# Patient Record
Sex: Female | Born: 1990 | Race: White | Hispanic: No | Marital: Married | Smoking: Never smoker
Health system: Southern US, Community
[De-identification: ages and names within clinical notes are randomized; demographics above are authoritative.]

## PROBLEM LIST (undated history)

## (undated) ENCOUNTER — Inpatient Hospital Stay (HOSPITAL_COMMUNITY): Payer: Self-pay

## (undated) DIAGNOSIS — F418 Other specified anxiety disorders: Secondary | ICD-10-CM

## (undated) HISTORY — DX: Other specified anxiety disorders: F41.8

## (undated) HISTORY — PX: TYMPANOSTOMY TUBE PLACEMENT: SHX32

---
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
## 2011-05-09 ENCOUNTER — Emergency Department: Payer: Self-pay | Admitting: Emergency Medicine

## 2011-06-07 ENCOUNTER — Ambulatory Visit: Payer: Self-pay

## 2011-09-25 ENCOUNTER — Ambulatory Visit: Payer: Self-pay | Admitting: Family Medicine

## 2013-05-06 ENCOUNTER — Other Ambulatory Visit: Payer: Self-pay | Admitting: Orthopedic Surgery

## 2013-05-06 DIAGNOSIS — M25562 Pain in left knee: Secondary | ICD-10-CM

## 2013-05-12 ENCOUNTER — Ambulatory Visit
Admission: RE | Admit: 2013-05-12 | Discharge: 2013-05-12 | Disposition: A | Discharge status: Home / Self Care | Payer: BC Managed Care – PPO | Source: Ambulatory Visit | Attending: Orthopedic Surgery | Admitting: Orthopedic Surgery

## 2013-05-12 DIAGNOSIS — M25562 Pain in left knee: Secondary | ICD-10-CM

## 2015-07-19 ENCOUNTER — Ambulatory Visit (INDEPENDENT_AMBULATORY_CARE_PROVIDER_SITE_OTHER): Payer: BLUE CROSS/BLUE SHIELD | Admitting: Family Medicine

## 2015-07-19 ENCOUNTER — Encounter: Payer: Self-pay | Admitting: Family Medicine

## 2015-07-19 VITALS — BP 102/74 | HR 80 | Temp 98.1°F | Resp 16 | Ht 65.0 in | Wt 140.0 lb

## 2015-07-19 DIAGNOSIS — H8103 Meniere's disease, bilateral: Secondary | ICD-10-CM

## 2015-07-19 DIAGNOSIS — J209 Acute bronchitis, unspecified: Secondary | ICD-10-CM

## 2015-07-19 DIAGNOSIS — Z3009 Encounter for other general counseling and advice on contraception: Secondary | ICD-10-CM | POA: Insufficient documentation

## 2015-07-19 DIAGNOSIS — F900 Attention-deficit hyperactivity disorder, predominantly inattentive type: Secondary | ICD-10-CM | POA: Insufficient documentation

## 2015-07-19 DIAGNOSIS — N83209 Unspecified ovarian cyst, unspecified side: Secondary | ICD-10-CM | POA: Insufficient documentation

## 2015-07-19 DIAGNOSIS — D75839 Thrombocytosis, unspecified: Secondary | ICD-10-CM | POA: Insufficient documentation

## 2015-07-19 DIAGNOSIS — D473 Essential (hemorrhagic) thrombocythemia: Secondary | ICD-10-CM | POA: Insufficient documentation

## 2015-07-19 MED ORDER — AZITHROMYCIN 250 MG PO TABS: ORAL_TABLET | ORAL | Status: DC

## 2015-07-19 MED ORDER — FLUCONAZOLE 150 MG PO TABS: 150.0000 mg | ORAL_TABLET | Freq: Once | ORAL | Status: DC

## 2015-07-19 NOTE — Progress Notes (Signed)
Subjective:    Patient ID: Ariel Thompson, female    DOB: 09-Jan-1991, 25 y.o.   MRN: [id number]  URI  This is a new problem. The current episode started in the past 7 days. The problem has been gradually worsening. There has been no fever. Associated symptoms include chest pain (secondary to cough), congestion, coughing (dry), a plugged ear sensation (pt has Meniere's dx), rhinorrhea, sneezing, a sore throat (from coughing) and swollen glands. Pertinent negatives include no abdominal pain, ear pain, headaches, nausea, sinus pain, vomiting or wheezing. Treatments tried: Mucinex DM, Allegra. The treatment provided mild relief.  Pt reports she is getting married on Saturday, and is requesting relief from sx before her wedding day.    Review of Systems  HENT: Positive for congestion, rhinorrhea, sneezing and sore throat (from coughing). Negative for ear pain.   Respiratory: Positive for cough (dry). Negative for wheezing.   Cardiovascular: Positive for chest pain (secondary to cough).  Gastrointestinal: Negative for nausea, vomiting and abdominal pain.  Neurological: Negative for headaches.   BP 102/74 mmHg  Pulse 80  Temp(Src) 98.1 F (36.7 C) (Oral)  Resp 16  Ht 5\' 5"  (1.651 m)  Wt 140 lb (63.504 kg)  BMI 23.30 kg/m2  SpO2 96%   Patient Active Problem List   Diagnosis Date Noted  . 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 Family planning 07/19/2015  . Cyst of ovary 07/19/2015  . Thrombocythemia ([place]) 07/19/2015  . Headache, migraine 12/18/2006  . Irregular bleeding 12/12/2006  . Allergic rhinitis 11/14/2005   History reviewed. No pertinent past medical history. No current outpatient prescriptions on file prior to visit.   No current facility-administered medications on file prior to visit.   Allergies  Allergen Reactions  . Hydrocodone-Acetaminophen     tachycardia, anxiety, hallucinations.   Past Surgical History  Procedure  Laterality Date  . Tonsillectomy  2010  . Tympanostomy tube placement     Social History   Social History  . Marital Status: Married    Spouse Name: N/A  . Number of Children: N/A  . Years of Education: N/A   Occupational History  . Not on file.   Social History Main Topics  . Smoking status: Never Smoker   . Smokeless tobacco: Never Used  . Alcohol Use: Yes     Comment: occasional  . Drug Use: No  . Sexual Activity: Yes    Birth Control/ Protection: IUD   Other Topics Concern  . Not on file   Social History Narrative   Family History  Problem Relation Age of Onset  . Mitral valve prolapse Mother   . Migraines Mother   . Other Mother     IBS  . Hypothyroidism Mother   . Depression Father   . Asthma Sister   . Migraines Sister   . Allergic rhinitis Sister   . Allergic rhinitis Brother   . Migraines Brother   . Heart Problems Brother     heart valve abnormality       Objective:   Physical Exam  Constitutional: She appears well-developed and well-nourished.  HENT:  Head: Normocephalic.  Right Ear: A middle ear effusion is present.  Left Ear: A middle ear effusion is present.  Mouth/Throat: Oropharynx is clear and moist.  Turbinates are swollen  Eyes: Conjunctivae are normal.  Cardiovascular: Normal rate, regular rhythm and normal heart sounds.   Pulmonary/Chest: Effort normal and breath sounds normal. No respiratory distress. She has no wheezes.  Psychiatric: She has a normal mood and affect. Her behavior is normal.   BP 102/74 mmHg  Pulse 80  Temp(Src) 98.1 F (36.7 C) (Oral)  Resp 16  Ht 5\' 5"  (1.651 m)  Wt 140 lb (63.504 kg)  BMI 23.30 kg/m2  SpO2 96%     Assessment & Plan:  1. Meniere disease, bilateral Unchanged.   2. Acute bronchitis, unspecified organism New problem, worsening. Will write for medication noted but would like patient to wait for one more day before starting. Start only if does not improve on it's own.  Patient instructed  to call back if condition worsens or does not improve with treatment. Is getting married this weekend and going to [place].   - azithromycin (ZITHROMAX) 250 MG tablet; Take 2 tablets on day 1, then 1 tablet each remaining day until gone.  Dispense: 6 each; Refill: 0 - fluconazole (DIFLUCAN) 150 MG tablet; Take 1 tablet (150 mg total) by mouth once.  Dispense: 1 tablet; Refill: 0  Patient was seen and examined by Jerrell Belfast, MD, and note scribed by Renaldo Fiddler, CMA. I have reviewed the document for accuracy and completeness and I agree with above. Jerrell Belfast, MD   Margarita Rana, MD

## 2016-02-14 LAB — HM PAP SMEAR

## 2016-04-16 ENCOUNTER — Encounter: Payer: Self-pay | Admitting: Physician Assistant

## 2016-04-16 ENCOUNTER — Other Ambulatory Visit: Payer: Self-pay | Admitting: Physician Assistant

## 2016-04-16 ENCOUNTER — Ambulatory Visit (INDEPENDENT_AMBULATORY_CARE_PROVIDER_SITE_OTHER): Payer: BC Managed Care – PPO | Admitting: Physician Assistant

## 2016-04-16 VITALS — BP 122/84 | HR 82 | Temp 98.4°F | Resp 16 | Wt 171.0 lb

## 2016-04-16 DIAGNOSIS — N912 Amenorrhea, unspecified: Secondary | ICD-10-CM | POA: Diagnosis not present

## 2016-04-16 DIAGNOSIS — R110 Nausea: Secondary | ICD-10-CM | POA: Diagnosis not present

## 2016-04-16 LAB — POCT URINE PREGNANCY: Preg Test, Ur: NEGATIVE

## 2016-04-16 NOTE — Progress Notes (Signed)
       Patient: Ariel Thompson Female    DOB: [birth year]   25 y.o.   MRN: [id number] Visit Date: 04/16/2016  Today's Provider: Trinna Post, PA-C   Chief Complaint  Patient presents with  . Nausea   Subjective:    HPI Patient comes in office today with concerns of nausea since 04/12/16. Patient reports that she has concerns that she might possibly be pregnant. She has the [place] IUD. She gets periods about once every two months.  Patient states that she has been fatigued and nauseas from certain foods she normally likes and eats. She has been nauseated without vomiting. No problems with bowels or bladder. She drinks coffee in the morning. Denies a lot of oily/fried foods. Says nausea is worse at night when she lies down. Does report eating food late at night and also spicy foods.    Allergies  Allergen Reactions  . Hydrocodone-Acetaminophen     tachycardia, anxiety, hallucinations.     Current Outpatient Prescriptions:  .  Levonorgestrel (SKYLA) 13.5 MG IUD, by Intrauterine route., Disp: , Rfl:   Review of Systems  Constitutional: Positive for appetite change and fatigue. Negative for activity change, chills, diaphoresis and unexpected weight change.  HENT: Negative.   Eyes: Negative.   Respiratory: Negative.   Cardiovascular: Negative.   Gastrointestinal: Positive for nausea.  Endocrine: Negative.   Genitourinary: Positive for vaginal discharge.  Musculoskeletal: Negative.   Skin: Negative.   Allergic/Immunologic: Negative.   Neurological: Negative.   Hematological: Negative.   Psychiatric/Behavioral: Negative.     Social History  Substance Use Topics  . Smoking status: Never Smoker  . Smokeless tobacco: Never Used  . Alcohol use Yes     Comment: occasional   Objective:   BP 122/84   Pulse 82   Temp 98.4 F (36.9 C) (Oral)   Resp 16   Wt 171 lb (77.6 kg)   LMP 01/22/2016   BMI 28.46 kg/m  Vitals:   04/16/16 1509  BP: 122/84  Pulse: 82  Resp: 16    Temp: 98.4 F (36.9 C)  TempSrc: Oral  Weight: 171 lb (77.6 kg)     Physical Exam  Constitutional: She appears well-developed and well-nourished.  Cardiovascular: Normal rate and regular rhythm.   Pulmonary/Chest: Effort normal and breath sounds normal.  Abdominal: Soft. Bowel sounds are normal. She exhibits no distension and no mass. There is no tenderness. There is no rebound and no guarding.  Skin: Skin is warm and dry.  Psychiatric: She has a normal mood and affect. Her behavior is normal.        Assessment & Plan:     1. Absence of menstruation  Will evaluate as below.  - B-HCG Quant  2. Nausea  Negative urine pregnancy. Will get HCG. Suspect nausea is 2/2 heartburn, patient can try omeprazole OTC to see if this helps symptoms.  - POCT urine pregnancy - B-HCG Quant  Return if symptoms worsen or fail to improve.  The entirety of the information documented in the History of Present Illness, Review of Systems and Physical Exam were personally obtained by me. Portions of this information were initially documented by Nunzio Cory and reviewed by me for thoroughness and accuracy.           Trinna Post, PA-C  [facility]

## 2016-04-16 NOTE — Progress Notes (Deleted)
Name: Ariel Thompson   MRN: [id number]    DOB: 06-23-1990   Date:04/16/2016       Progress Note  Subjective  Chief Complaint  No chief complaint on file.   HPI  *** No problem-specific Assessment & Plan notes found for this encounter.   No past medical history on file.  Social History  Substance Use Topics  . Smoking status: Never Smoker  . Smokeless tobacco: Never Used  . Alcohol use Yes     Comment: occasional     Current Outpatient Prescriptions:  .  azithromycin (ZITHROMAX) 250 MG tablet, Take 2 tablets on day 1, then 1 tablet each remaining day until gone., Disp: 6 each, Rfl: 0 .  fexofenadine (ALLEGRA) 180 MG tablet, Take 180 mg by mouth daily., Disp: , Rfl:  .  fluconazole (DIFLUCAN) 150 MG tablet, Take 1 tablet (150 mg total) by mouth once., Disp: 1 tablet, Rfl: 0 .  Levonorgestrel (SKYLA) 13.5 MG IUD, by Intrauterine route., Disp: , Rfl:   Allergies  Allergen Reactions  . Hydrocodone-Acetaminophen     tachycardia, anxiety, hallucinations.    ROS  ***  Objective  There were no vitals filed for this visit.   Physical Exam  ***  No results found for this or any previous visit (from the past 2160 hour(s)).   Assessment & Plan  1. Absence of menstruation *** - POCT urine pregnancy

## 2016-04-16 NOTE — Patient Instructions (Signed)
Gastroesophageal Reflux Disease, Adult Normally, food travels down the esophagus and stays in the stomach to be digested. If a person has gastroesophageal reflux disease (GERD), food and stomach acid move back up into the esophagus. When this happens, the esophagus becomes sore and swollen (inflamed). Over time, GERD can make small holes (ulcers) in the lining of the esophagus. Follow these instructions at home: Diet   Follow a diet as told by your doctor. You may need to avoid foods and drinks such as:  Coffee and tea (with or without caffeine).  Drinks that contain alcohol.  Energy drinks and sports drinks.  Carbonated drinks or sodas.  Chocolate and cocoa.  Peppermint and mint flavorings.  Garlic and onions.  Horseradish.  Spicy and acidic foods, such as peppers, chili powder, curry powder, vinegar, hot sauces, and BBQ sauce.  Citrus fruit juices and citrus fruits, such as oranges, lemons, and limes.  Tomato-based foods, such as red sauce, chili, salsa, and pizza with red sauce.  Fried and fatty foods, such as donuts, french fries, potato chips, and high-fat dressings.  High-fat meats, such as hot dogs, rib eye steak, sausage, ham, and bacon.  High-fat dairy items, such as whole milk, butter, and cream cheese.  Eat small meals often. Avoid eating large meals.  Avoid drinking large amounts of liquid with your meals.  Avoid eating meals during the 2-3 hours before bedtime.  Avoid lying down right after you eat.  Do not exercise right after you eat. General instructions   Pay attention to any changes in your symptoms.  Take over-the-counter and prescription medicines only as told by your doctor. Do not take aspirin, ibuprofen, or other NSAIDs unless your doctor says it is okay.  Do not use any tobacco products, including cigarettes, chewing tobacco, and e-cigarettes. If you need help quitting, ask your doctor.  Wear loose clothes. Do not wear anything tight around  your waist.  Raise (elevate) the head of your bed about 6 inches (15 cm).  Try to lower your stress. If you need help doing this, ask your doctor.  If you are overweight, lose an amount of weight that is healthy for you. Ask your doctor about a safe weight loss goal.  Keep all follow-up visits as told by your doctor. This is important. Contact a doctor if:  You have new symptoms.  You lose weight and you do not know why it is happening.  You have trouble swallowing, or it hurts to swallow.  You have wheezing or a cough that keeps happening.  Your symptoms do not get better with treatment.  You have a hoarse voice. Get help right away if:  You have pain in your arms, neck, jaw, teeth, or back.  You feel sweaty, dizzy, or light-headed.  You have chest pain or shortness of breath.  You throw up (vomit) and your throw up looks like blood or coffee grounds.  You pass out (faint).  Your poop (stool) is bloody or black.  You cannot swallow, drink, or eat. This information is not intended to replace advice given to you by your health care provider. Make sure you discuss any questions you have with your health care provider. Document Released: 07/23/2007 Document Revised: 07/12/2015 Document Reviewed: 05/31/2014 Elsevier Interactive Patient Education  2017 [employer].

## 2016-04-17 ENCOUNTER — Telehealth: Payer: Self-pay

## 2016-04-17 LAB — BETA HCG QUANT (REF LAB): hCG Quant: <1 m[IU]/mL

## 2016-04-17 NOTE — Telephone Encounter (Signed)
LMTCB 04/17/2016  Thanks,   -Mickel Baas

## 2016-04-17 NOTE — Telephone Encounter (Signed)
Pt returned your call. ° °ThanksTeri °

## 2016-04-17 NOTE — Telephone Encounter (Signed)
Patient advised.

## 2016-04-17 NOTE — Telephone Encounter (Signed)
-----   Message from Trinna Post, [place] sent at 04/17/2016  9:37 AM EST ----- Beta HCG <1, indicating not pregnant.

## 2016-08-11 ENCOUNTER — Telehealth: Payer: Self-pay

## 2016-08-11 DIAGNOSIS — Z8639 Personal history of other endocrine, nutritional and metabolic disease: Secondary | ICD-10-CM

## 2016-08-11 DIAGNOSIS — N926 Irregular menstruation, unspecified: Secondary | ICD-10-CM

## 2016-08-11 DIAGNOSIS — D75839 Thrombocytosis, unspecified: Secondary | ICD-10-CM

## 2016-08-11 DIAGNOSIS — R5383 Other fatigue: Secondary | ICD-10-CM

## 2016-08-11 DIAGNOSIS — Z8349 Family history of other endocrine, nutritional and metabolic diseases: Secondary | ICD-10-CM

## 2016-08-11 DIAGNOSIS — D473 Essential (hemorrhagic) thrombocythemia: Secondary | ICD-10-CM

## 2016-08-11 DIAGNOSIS — R635 Abnormal weight gain: Secondary | ICD-10-CM

## 2016-08-11 NOTE — Telephone Encounter (Signed)
Yes, I will take her--I do need to see her for justification for labs.

## 2016-08-11 NOTE — Telephone Encounter (Signed)
Please review-aa 

## 2016-08-11 NOTE — Telephone Encounter (Signed)
Patient is formerly a patient of Dr. Sharyon Medicus, and her mother [name]) is currently a patient of Dr. Rosanna Randy. She is requesting that she have labs done due to gaining 20lbs since the beginning of the year. She is requesting that Dr. Rosanna Randy order a TSH, Met C, and iron levels. Advised mother that she must be seen in order to have labs done. Also advised mother that we will have to see if Dr. Rosanna Randy would be willing to take her on as a new patient since he has not seen her before. 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 Rosanna Randy would not have a problem seeing her, but I told her that I had to ask.   Would you be willing to accept the patient? If so, does she need to be seen? Please advise. Thanks!

## 2016-08-11 NOTE — Telephone Encounter (Signed)
Ariel Thompson

## 2016-08-15 LAB — COMPREHENSIVE METABOLIC PANEL
ALT: 25 IU/L (ref 0–32)
AST: 20 IU/L (ref 0–40)
Albumin/Globulin Ratio: 1.7 (ref 1.2–2.2)
Albumin: 4.6 g/dL (ref 3.5–5.5)
Alkaline Phosphatase: 63 IU/L (ref 39–117)
BUN/Creatinine Ratio: 17 (ref 9–23)
BUN: 13 mg/dL (ref 6–20)
Bilirubin Total: 0.3 mg/dL (ref 0.0–1.2)
CO2: 22 mmol/L (ref 20–29)
Calcium: 9.5 mg/dL (ref 8.7–10.2)
Chloride: 101 mmol/L (ref 96–106)
Creatinine, Ser: 0.77 mg/dL (ref 0.57–1.00)
GFR calc Af Amer: 125 mL/min/{1.73_m2} (ref >59)
GFR calc non Af Amer: 108 mL/min/{1.73_m2} (ref >59)
Globulin, Total: 2.7 g/dL (ref 1.5–4.5)
Glucose: 94 mg/dL (ref 65–99)
Potassium: 4.2 mmol/L (ref 3.5–5.2)
Sodium: 139 mmol/L (ref 134–144)
Total Protein: 7.3 g/dL (ref 6.0–8.5)

## 2016-08-15 LAB — HEMOGLOBIN A1C
Est. average glucose Bld gHb Est-mCnc: 103 mg/dL
Hgb A1c MFr Bld: 5.2 % (ref 4.8–5.6)

## 2016-08-15 LAB — CBC WITH DIFFERENTIAL/PLATELET
Basophils Absolute: 0 10*3/uL (ref 0.0–0.2)
Basos: 0 %
EOS (ABSOLUTE): 0.2 10*3/uL (ref 0.0–0.4)
Eos: 2 %
Hematocrit: 39.5 % (ref 34.0–46.6)
Hemoglobin: 13.3 g/dL (ref 11.1–15.9)
Immature Grans (Abs): 0 10*3/uL (ref 0.0–0.1)
Immature Granulocytes: 0 %
Lymphocytes Absolute: 2.6 10*3/uL (ref 0.7–3.1)
Lymphs: 36 %
MCH: 29.3 pg (ref 26.6–33.0)
MCHC: 33.7 g/dL (ref 31.5–35.7)
MCV: 87 fL (ref 79–97)
Monocytes Absolute: 0.6 10*3/uL (ref 0.1–0.9)
Monocytes: 9 %
Neutrophils Absolute: 3.9 10*3/uL (ref 1.4–7.0)
Neutrophils: 53 %
Platelets: 329 10*3/uL (ref 150–379)
RBC: 4.54 x10E6/uL (ref 3.77–5.28)
RDW: 12.9 % (ref 12.3–15.4)
WBC: 7.3 10*3/uL (ref 3.4–10.8)

## 2016-08-15 LAB — LIPID PANEL WITH LDL/HDL RATIO
Cholesterol, Total: 168 mg/dL (ref 100–199)
HDL: 60 mg/dL (ref >39)
LDL Calculated: 92 mg/dL (ref 0–99)
LDl/HDL Ratio: 1.5 ratio (ref 0.0–3.2)
Triglycerides: 78 mg/dL (ref 0–149)
VLDL Cholesterol Cal: 16 mg/dL (ref 5–40)

## 2016-08-15 LAB — IRON: Iron: 88 ug/dL (ref 27–159)

## 2016-08-15 LAB — TSH: TSH: 2.9 u[IU]/mL (ref 0.450–4.500)

## 2016-08-18 ENCOUNTER — Telehealth: Payer: Self-pay | Admitting: Physician Assistant

## 2016-08-18 NOTE — Telephone Encounter (Signed)
Pt advised of lab results.   Thanks,   -Judea Riches  

## 2016-08-18 NOTE — Telephone Encounter (Signed)
Pt is returning call about lab results.  CB#[phone]/MW

## 2016-10-02 ENCOUNTER — Encounter: Payer: Self-pay | Admitting: Family Medicine

## 2016-10-02 ENCOUNTER — Ambulatory Visit (INDEPENDENT_AMBULATORY_CARE_PROVIDER_SITE_OTHER): Payer: BC Managed Care – PPO | Admitting: Family Medicine

## 2016-10-02 VITALS — BP 110/82 | HR 72 | Temp 98.1°F | Wt 190.4 lb

## 2016-10-02 DIAGNOSIS — F419 Anxiety disorder, unspecified: Secondary | ICD-10-CM

## 2016-10-02 DIAGNOSIS — R5383 Other fatigue: Secondary | ICD-10-CM

## 2016-10-02 DIAGNOSIS — R635 Abnormal weight gain: Secondary | ICD-10-CM

## 2016-10-02 MED ORDER — DULOXETINE HCL 30 MG PO CPEP: 30.0000 mg | ORAL_CAPSULE | Freq: Every day | ORAL | 0 refills | Status: DC

## 2016-10-02 NOTE — Progress Notes (Signed)
Patient: Ariel Thompson Female    DOB: 27-Dec-1990   26 y.o.   MRN: [id number] Visit Date: 10/02/2016  Today's Provider: Wilhemena Durie, MD   Chief Complaint  Patient presents with  . Discuss lab results   Subjective:    HPI Patient is here to discuss lab results done on 08/14/2016. Labs were WNL. Patient states she is experiencing weight gain, fatigue (feels tired all the time), abdominal cramping especially in lower abdomen. She states she has been eating healthy and exercising, but weight continues to increase especially over the summer break. She does have a family history of thyroid disease including mother, MGM, PGF, and MGF. PHQ-9 is 9 today.  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 Wt Readings from Last 3 Encounters:  10/02/16 190 lb 6.4 oz (86.4 kg)  04/16/16 171 lb (77.6 kg)  07/19/15 140 lb (63.5 kg)      Depression screen PHQ 2/9 10/02/2016  Decreased Interest 2  Down, Depressed, Hopeless 2  PHQ - 2 Score 4  Altered sleeping 2  Tired, decreased energy 2  Change in appetite 1  Feeling bad or failure about yourself  0  Trouble concentrating 0  Moving slowly or fidgety/restless 0  Suicidal thoughts 0  PHQ-9 Score 9  Difficult doing work/chores Somewhat difficult   Previous Medications   LEVONORGESTREL (SKYLA) 13.5 MG IUD    by Intrauterine route.   MULTIPLE VITAMIN (MULTIVITAMIN) TABLET    Take 1 tablet by mouth daily.    Review of Systems  Constitutional: Negative.   Respiratory: Negative.   Cardiovascular: Negative.     Social History  Substance Use Topics  . Smoking status: Never Smoker  . Smokeless tobacco: Never Used  . Alcohol use Yes     Comment: occasional   Objective:   BP 110/82 (BP Location: Left Arm, Patient Position: Sitting, Cuff Size: Normal)   Pulse 72   Temp 98.1 F (36.7 C) (Oral)   Wt 190 lb 6.4 oz (86.4 kg)   LMP 08/21/2016   SpO2 99%   BMI 31.68 kg/m   Physical Exam    Constitutional: She is oriented to person, place, and time. She appears well-developed and well-nourished.  HENT:  Head: Normocephalic and atraumatic.  Right Ear: External ear normal.  Left Ear: External ear normal.  Mouth/Throat: Oropharynx is clear and moist.  Eyes: Pupils are equal, round, and reactive to light. Conjunctivae and EOM are normal.  Neck: Normal range of motion. Neck supple.  Cardiovascular: Normal rate, regular rhythm, normal heart sounds and intact distal pulses.   Pulmonary/Chest: Effort normal and breath sounds normal.  Abdominal: Soft.  Musculoskeletal: Normal range of motion.  Neurological: She is alert and oriented to person, place, and time. She has normal reflexes.  Skin: Skin is warm and dry.  Psychiatric: She has a normal mood and affect. Her behavior is normal. Judgment and thought content normal.        Assessment & Plan:     1. Anxiety New diagnosis. Start Cymbalta, referral to Dr. Carrolyn Meiers and follow up in 1 month - DULoxetine (CYMBALTA) 30 MG capsule; Take 1 capsule (30 mg total) by mouth daily.  Dispense: 30 capsule; Refill: 0 - Ambulatory referral to Endocrinology  2. Unintended weight gain Recheck labs.Refer to dr Forde Dandy per request of mother. - Cortisol - TSH  3. Other fatigue Check labs, started Cymbalta 30 mg for anxiety  Follow up: Return  in about 1 month (around 11/02/2016).     HPI, Exam and A&P transcribed by Tiburcio Pea CMA under direction and in the presence of Miguel Aschoff, MD. I have done the exam and reviewed the chart and it is accurate to the best of my knowledge. Development worker, community has been used and  any errors in dictation or transcription are unintentional. Miguel Aschoff M.D. [facility]

## 2016-10-03 LAB — CORTISOL: Cortisol: 13.8 ug/dL

## 2016-10-03 LAB — TSH: TSH: 3.78 u[IU]/mL (ref 0.450–4.500)

## 2016-10-29 ENCOUNTER — Other Ambulatory Visit: Payer: Self-pay | Admitting: Family Medicine

## 2016-10-29 DIAGNOSIS — F419 Anxiety disorder, unspecified: Secondary | ICD-10-CM

## 2016-10-30 ENCOUNTER — Ambulatory Visit (INDEPENDENT_AMBULATORY_CARE_PROVIDER_SITE_OTHER): Payer: BC Managed Care – PPO | Admitting: Family Medicine

## 2016-10-30 ENCOUNTER — Encounter: Payer: Self-pay | Admitting: Family Medicine

## 2016-10-30 DIAGNOSIS — F419 Anxiety disorder, unspecified: Secondary | ICD-10-CM

## 2016-10-30 MED ORDER — DULOXETINE HCL 30 MG PO CPEP: 30.0000 mg | ORAL_CAPSULE | Freq: Every day | ORAL | 3 refills | Status: DC

## 2016-10-30 NOTE — Progress Notes (Signed)
       Patient: Ariel Thompson Female    DOB: [birth year]   26 y.o.   MRN: [id number] Visit Date: 10/30/2016  Today's Provider: Wilhemena Durie, MD   Chief Complaint  Patient presents with  . Anxiety   Subjective:    HPI   Anxiety-  1 month follow up. She was started on Cymbalta at last OV. Pt reports that she is feeling much better. She is getting to bed earlier than she was. She had some nausea at first but has subsided. She had some dry mouth with it but she drinks more water.      Allergies  Allergen Reactions  . Hydrocodone-Acetaminophen     tachycardia, anxiety, hallucinations.     Current Outpatient Prescriptions:  .  DULoxetine (CYMBALTA) 30 MG capsule, TAKE 1 CAPSULE BY MOUTH EVERY DAY, Disp: 30 capsule, Rfl: 2 .  Levonorgestrel (SKYLA) 13.5 MG IUD, by Intrauterine route., Disp: , Rfl:  .  Multiple Vitamin (MULTIVITAMIN) tablet, Take 1 tablet by mouth daily., Disp: , Rfl:   Review of Systems  Constitutional: Negative.   HENT: Negative.   Eyes: Negative.   Respiratory: Negative.   Cardiovascular: Negative.   Gastrointestinal: Negative.   Endocrine: Negative.   Genitourinary: Negative.   Musculoskeletal: Negative.   Skin: Negative.   Allergic/Immunologic: Negative.   Neurological: Negative.   Hematological: Negative.   Psychiatric/Behavioral: Negative.     Social History  Substance Use Topics  . Smoking status: Never Smoker  . Smokeless tobacco: Never Used  . Alcohol use Yes     Comment: occasional   Objective:   BP 110/72 (BP Location: Left Arm, Patient Position: Sitting, Cuff Size: Normal)   Pulse 68   Temp 98 F (36.7 C) (Oral)   Resp 16   Wt 187 lb (84.8 kg)   BMI 31.12 kg/m  Vitals:   10/30/16 1603  BP: 110/72  Pulse: 68  Resp: 16  Temp: 98 F (36.7 C)  TempSrc: Oral  Weight: 187 lb (84.8 kg)     Physical Exam      Assessment & Plan:     1. Anxiety Pt 75% better follow up in 3 months. Stay on this until at least next  summer.  - DULoxetine (CYMBALTA) 30 MG capsule; Take 1 capsule (30 mg total) by mouth daily.  Dispense: 90 capsule; Refill: 3      HPI, Exam, and A&P Transcribed under the direction and in the presence of Richard L. Cranford Mon, MD  Electronically Signed: Katina Dung, CMA  I have done the exam and reviewed the above chart and it is accurate to the best of my knowledge. Development worker, community has been used in this note in any air is in the dictation or transcription are unintentional.  Wilhemena Durie, MD  [place]

## 2016-12-01 ENCOUNTER — Encounter: Payer: Self-pay | Admitting: Family Medicine

## 2017-02-15 ENCOUNTER — Other Ambulatory Visit: Payer: Self-pay | Admitting: Family Medicine

## 2017-02-15 DIAGNOSIS — F419 Anxiety disorder, unspecified: Secondary | ICD-10-CM

## 2017-02-18 ENCOUNTER — Ambulatory Visit: Payer: Self-pay | Admitting: Family Medicine

## 2017-03-16 ENCOUNTER — Ambulatory Visit: Payer: BC Managed Care – PPO | Admitting: Family Medicine

## 2017-03-16 VITALS — BP 114/62 | HR 122 | Temp 98.9°F | Resp 16 | Wt 196.2 lb

## 2017-03-16 DIAGNOSIS — J111 Influenza due to unidentified influenza virus with other respiratory manifestations: Secondary | ICD-10-CM

## 2017-03-16 MED ORDER — OSELTAMIVIR PHOSPHATE 75 MG PO CAPS: 75.0000 mg | ORAL_CAPSULE | Freq: Two times a day (BID) | ORAL | 0 refills | Status: DC

## 2017-03-16 NOTE — Progress Notes (Signed)
Ariel Thompson  MRN: [id number] DOB: [birth year]  Subjective:  HPI  Patient is here due to not feeling well. Symptoms started suddenly yesterday 03/15/2017. Symptoms are post nasal drainage, sinus pressure, body aches this morning and has gotten worse, some cough but kind of usual-dry, some chest tightness, irritated throat, nausea from post nasal drainage, decreased appetite, ear aches. She has not taking any medications for her symptoms.  Patient Active Problem List   Diagnosis Date Noted  . 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 Family planning 07/19/2015  . Cyst of ovary 07/19/2015  . Thrombocythemia ([place]) 07/19/2015  . Headache, migraine 12/18/2006  . Irregular bleeding 12/12/2006  . Allergic rhinitis 11/14/2005    No past medical history on file.  Social History   Socioeconomic History  . Marital status: Married    Spouse name: Not on file  . Number of children: Not on file  . Years of education: Not on file  . Highest education level: Not on file  Social Needs  . Financial resource strain: Not on file  . Food insecurity - worry: Not on file  . Food insecurity - inability: Not on file  . Transportation needs - medical: Not on file  . Transportation needs - non-medical: Not on file  Occupational History  . Not on file  Tobacco Use  . Smoking status: Never Smoker  . Smokeless tobacco: Never Used  Substance and Sexual Activity  . Alcohol use: Yes    Comment: occasional  . Drug use: No  . Sexual activity: Yes    Birth control/protection: IUD  Other Topics Concern  . Not on file  Social History Narrative  . Not on file    Outpatient Encounter Medications as of 03/16/2017  Medication Sig  . DULoxetine (CYMBALTA) 30 MG capsule TAKE 1 CAPSULE BY MOUTH EVERY DAY  . Levonorgestrel (SKYLA) 13.5 MG IUD by Intrauterine route.  . Multiple Vitamin (MULTIVITAMIN) tablet Take 1 tablet by mouth daily.   No facility-administered  encounter medications on file as of 03/16/2017.     Allergies  Allergen Reactions  . Hydrocodone-Acetaminophen     tachycardia, anxiety, hallucinations.    Review of Systems  Constitutional: Positive for malaise/fatigue.  HENT: Positive for congestion, ear pain, sinus pain and sore throat.   Respiratory: Positive for cough. Negative for sputum production, shortness of breath and wheezing.   Cardiovascular: Negative.   Gastrointestinal: Positive for nausea. Negative for vomiting.  Musculoskeletal: Positive for back pain, joint pain and myalgias.  Neurological: Positive for weakness and headaches.    Objective:  BP 114/62   Pulse (!) 122   Temp 98.9 F (37.2 C)   Resp 16   Wt 196 lb 3.2 oz (89 kg)   SpO2 99%   BMI 32.65 kg/m   Physical Exam  Constitutional: She is well-developed, well-nourished, and in no distress.  HENT:  Head: Normocephalic and atraumatic.  Right Ear: External ear normal.  Nose: Nose normal.  Mouth/Throat: Oropharynx is clear and moist.  Eyes: Conjunctivae are normal. No scleral icterus.  Neck: No thyromegaly present.  Cardiovascular: Normal rate, regular rhythm and normal heart sounds.  Pulmonary/Chest: Breath sounds normal.  Lymphadenopathy:    She has no cervical adenopathy.  Neurological: She is alert. Gait normal. GCS score is 15.  Skin: Skin is warm and dry.  Psychiatric: Mood, memory, affect and judgment normal.    Assessment and Plan :  Clinical Influenza Rx witjh Tamiflu for 5 day. Rx  husband Vonna Kotyk prophylactically. MDD F/u this spring.  I have done the exam and reviewed the chart and it is accurate to the best of my knowledge. Development worker, community has been used and  any errors in dictation or transcription are unintentional. Miguel Aschoff M.D. I have done the exam and reviewed the chart and it is accurate to the best of my knowledge. Development worker, community has been used and  any errors in dictation or transcription are  unintentional. Miguel Aschoff M.D. [facility]

## 2017-06-04 ENCOUNTER — Other Ambulatory Visit: Payer: Self-pay | Admitting: Family Medicine

## 2017-06-04 DIAGNOSIS — F419 Anxiety disorder, unspecified: Secondary | ICD-10-CM

## 2017-11-05 ENCOUNTER — Ambulatory Visit
Admission: RE | Admit: 2017-11-05 | Discharge: 2017-11-05 | Disposition: A | Discharge status: Home / Self Care | Payer: BC Managed Care – PPO | Source: Ambulatory Visit | Attending: Family Medicine | Admitting: Family Medicine

## 2017-11-05 ENCOUNTER — Telehealth: Payer: Self-pay

## 2017-11-05 ENCOUNTER — Encounter: Payer: Self-pay | Admitting: Family Medicine

## 2017-11-05 ENCOUNTER — Ambulatory Visit: Payer: BC Managed Care – PPO | Admitting: Family Medicine

## 2017-11-05 ENCOUNTER — Other Ambulatory Visit: Payer: Self-pay | Admitting: Family Medicine

## 2017-11-05 ENCOUNTER — Other Ambulatory Visit
Admission: RE | Admit: 2017-11-05 | Discharge: 2017-11-05 | Disposition: A | Discharge status: Home / Self Care | Payer: BC Managed Care – PPO | Source: Ambulatory Visit | Attending: Family Medicine | Admitting: Family Medicine

## 2017-11-05 VITALS — BP 144/82 | HR 120 | Temp 98.0°F | Resp 20 | Wt 204.0 lb

## 2017-11-05 DIAGNOSIS — R0781 Pleurodynia: Secondary | ICD-10-CM

## 2017-11-05 DIAGNOSIS — Z86711 Personal history of pulmonary embolism: Secondary | ICD-10-CM

## 2017-11-05 DIAGNOSIS — R5383 Other fatigue: Secondary | ICD-10-CM

## 2017-11-05 DIAGNOSIS — R079 Chest pain, unspecified: Secondary | ICD-10-CM

## 2017-11-05 DIAGNOSIS — F419 Anxiety disorder, unspecified: Secondary | ICD-10-CM

## 2017-11-05 DIAGNOSIS — R0789 Other chest pain: Secondary | ICD-10-CM

## 2017-11-05 LAB — RENAL FUNCTION PANEL
Albumin: 4.5 g/dL (ref 3.5–5.0)
Anion gap: 8 (ref 5–15)
BUN: 14 mg/dL (ref 6–20)
CO2: 26 mmol/L (ref 22–32)
Calcium: 9.6 mg/dL (ref 8.9–10.3)
Chloride: 103 mmol/L (ref 98–111)
Creatinine, Ser: 0.7 mg/dL (ref 0.44–1.00)
GFR calc Af Amer: >60 mL/min (ref >60)
GFR calc non Af Amer: >60 mL/min (ref >60)
Glucose, Bld: 86 mg/dL (ref 70–99)
Phosphorus: 4 mg/dL (ref 2.5–4.6)
Potassium: 3.7 mmol/L (ref 3.5–5.1)
Sodium: 137 mmol/L (ref 135–145)

## 2017-11-05 LAB — CBC WITH DIFFERENTIAL/PLATELET
Basophils Absolute: 0.1 10*3/uL (ref 0–0.1)
Basophils Relative: 1 %
Eosinophils Absolute: 0.2 10*3/uL (ref 0–0.7)
Eosinophils Relative: 2 %
HCT: 37.5 % (ref 35.0–47.0)
Hemoglobin: 13.7 g/dL (ref 12.0–16.0)
Lymphocytes Relative: 24 %
Lymphs Abs: 2.5 10*3/uL (ref 1.0–3.6)
MCH: 31.1 pg (ref 26.0–34.0)
MCHC: 36.6 g/dL — ABNORMAL HIGH (ref 32.0–36.0)
MCV: 85.1 fL (ref 80.0–100.0)
Monocytes Absolute: 0.6 10*3/uL (ref 0.2–0.9)
Monocytes Relative: 6 %
Neutro Abs: 7.1 10*3/uL — ABNORMAL HIGH (ref 1.4–6.5)
Neutrophils Relative %: 67 %
Platelets: 345 10*3/uL (ref 150–440)
RBC: 4.41 MIL/uL (ref 3.80–5.20)
RDW: 12.8 % (ref 11.5–14.5)
WBC: 10.4 10*3/uL (ref 3.6–11.0)

## 2017-11-05 LAB — FIBRIN DERIVATIVES D-DIMER (ARMC ONLY): Fibrin derivatives D-dimer (ARMC): 196.36 ng/mL (FEU) (ref 0.00–499.00)

## 2017-11-05 LAB — TROPONIN I: Troponin I: <0.03 ng/mL (ref <0.03)

## 2017-11-05 MED ORDER — KETOROLAC TROMETHAMINE 60 MG/2ML IM SOLN
60.0000 mg | Freq: Once | INTRAMUSCULAR | Status: DC
  Administered 2017-11-05: 60 mg via INTRAMUSCULAR

## 2017-11-05 NOTE — Progress Notes (Signed)
Patient: Ariel Thompson Female    DOB: [birth year]   26 y.o.   MRN: [id number] Visit Date: 11/05/2017  Today's Provider: Wilhemena Durie, MD   Chief Complaint  Patient presents with  . Chest Pain   Subjective:    HPI Patient comes in today c/o chest pain. Patient reports that her symptoms started last night. She reports that it is progressively gotten worse since then. She has only taken Ibuprofen 600mg  last night. Patient reports that the pain started on her right side and now it is radiating up her right shoulder.   Patient denies shortness of breath, visual disturbance sweats or URI symptoms. Patient states that she has been taking otc Tums but pain persist. Patient states that she had concerns of bronchitis but denied symptom of cough and congestion    Allergies  Allergen Reactions  . Hydrocodone-Acetaminophen     tachycardia, anxiety, hallucinations.     Current Outpatient Medications:  .  DULoxetine (CYMBALTA) 30 MG capsule, TAKE 1 CAPSULE BY MOUTH EVERY DAY, Disp: 90 capsule, Rfl: 3 .  Levonorgestrel (SKYLA) 13.5 MG IUD, by Intrauterine route., Disp: , Rfl:  .  Multiple Vitamin (MULTIVITAMIN) tablet, Take 1 tablet by mouth daily., Disp: , Rfl:  .  oseltamivir (TAMIFLU) 75 MG capsule, Take 1 capsule (75 mg total) by mouth 2 (two) times daily. (Patient not taking: Reported on 11/05/2017), Disp: 10 capsule, Rfl: 0  Review of Systems  Constitutional: Positive for fatigue. Negative for activity change, appetite change, chills and diaphoresis.  HENT: Negative for congestion, sinus pressure and sinus pain.   Eyes: Negative.   Respiratory: Positive for chest tightness. Negative for cough, shortness of breath and wheezing.   Cardiovascular: Positive for chest pain and palpitations. Negative for leg swelling.  Endocrine: Negative.   Musculoskeletal: Negative.   Allergic/Immunologic: Negative.   Neurological: Negative for dizziness, tremors, seizures, speech  difficulty, weakness, light-headedness, numbness and headaches.  Psychiatric/Behavioral: Negative for agitation, confusion, decreased concentration, self-injury and suicidal ideas. The patient is nervous/anxious.     Social History   Tobacco Use  . Smoking status: Never Smoker  . Smokeless tobacco: Never Used  Substance Use Topics  . Alcohol use: Yes    Comment: occasional   Objective:   BP (!) 144/82 (BP Location: Right Arm, Patient Position: Sitting, Cuff Size: Normal)   Pulse (!) 120   Temp 98 F (36.7 C)   Resp 20   Wt 204 lb (92.5 kg)   SpO2 98%   BMI 33.95 kg/m  Vitals:   11/05/17 1500  BP: (!) 144/82  Pulse: (!) 120  Resp: 20  Temp: 98 F (36.7 C)  SpO2: 98%  Weight: 204 lb (92.5 kg)     Physical Exam  Constitutional: She is oriented to person, place, and time. She appears well-developed and well-nourished.  HENT:  Head: Normocephalic and atraumatic.  Cardiovascular: Normal rate, regular rhythm and normal pulses.  Pulmonary/Chest: Effort normal and breath sounds normal.  Abdominal: Soft.  Musculoskeletal:       Right lower leg: Normal.       Left lower leg: Normal.  Neurological: She is alert and oriented to person, place, and time.  Skin: Skin is warm and dry.  Psychiatric: She has a normal mood and affect. Her behavior is normal.        Assessment & Plan:     1. Chest pain, unspecified type Pleuritic in nature. R/o PE with D dimer and V/Q  scan. Toradol IM. Aleve BID for a few days. - EKG 12-Lead - DG Chest 2 View; Future - Troponin I - D-Dimer, Quantitative  2. Anxiety  - CBC with Differential/Platelet  3. Other fatigue  - CBC with Differential/Platelet - Renal function panel  I have done the exam and reviewed the chart and it is accurate to the best of my knowledge. Development worker, community has been used and  any errors in dictation or transcription are unintentional. Miguel Aschoff M.D. [place], MD  [facility]

## 2017-11-05 NOTE — Telephone Encounter (Signed)
FYI

## 2017-11-05 NOTE — Telephone Encounter (Signed)
Patient called office with complaints of chest pain on the right side that began suddenly last night after eating dinner, patient describes pain in chest as sharp and gradually getting worse as the day has gone bye. Patient denies any radiation in the chest or numbness or tingling associated. Patient denies shortness of breath, visual disturbance sweats or URI symptoms. Patient states that she has been taking otc Tums but pain persist. Patient states that she had concerns of bronchitis but denied symptom of cough and congestion. Advised patient to come in office today for evaluation. KW

## 2017-11-06 ENCOUNTER — Ambulatory Visit
Admission: RE | Admit: 2017-11-06 | Discharge: 2017-11-06 | Disposition: A | Discharge status: Home / Self Care | Payer: BC Managed Care – PPO | Source: Ambulatory Visit | Attending: Family Medicine | Admitting: Family Medicine

## 2017-11-06 ENCOUNTER — Ambulatory Visit: Payer: BC Managed Care – PPO | Admitting: Family Medicine

## 2017-11-06 ENCOUNTER — Encounter: Payer: Self-pay | Admitting: Family Medicine

## 2017-11-06 ENCOUNTER — Telehealth: Payer: Self-pay

## 2017-11-06 VITALS — BP 120/82 | HR 81 | Temp 98.7°F | Resp 16 | Wt 205.0 lb

## 2017-11-06 DIAGNOSIS — R918 Other nonspecific abnormal finding of lung field: Secondary | ICD-10-CM | POA: Diagnosis not present

## 2017-11-06 DIAGNOSIS — R0789 Other chest pain: Secondary | ICD-10-CM | POA: Diagnosis present

## 2017-11-06 DIAGNOSIS — R091 Pleurisy: Secondary | ICD-10-CM

## 2017-11-06 DIAGNOSIS — Z86711 Personal history of pulmonary embolism: Secondary | ICD-10-CM | POA: Diagnosis not present

## 2017-11-06 DIAGNOSIS — R0781 Pleurodynia: Secondary | ICD-10-CM | POA: Insufficient documentation

## 2017-11-06 MED ORDER — TECHNETIUM TC 99M DIETHYLENETRIAME-PENTAACETIC ACID
30.0000 | Freq: Once | INTRAVENOUS | Status: AC | PRN
  Administered 2017-11-06: 34.98 via INTRAVENOUS

## 2017-11-06 MED ORDER — TECHNETIUM TO 99M ALBUMIN AGGREGATED
3.8200 | Freq: Once | INTRAVENOUS | Status: AC | PRN
  Administered 2017-11-06: 3.82 via INTRAVENOUS

## 2017-11-06 MED ORDER — KETOROLAC TROMETHAMINE 60 MG/2ML IM SOLN
60.0000 mg | Freq: Once | INTRAMUSCULAR | Status: AC
  Administered 2017-11-06: 60 mg via INTRAMUSCULAR

## 2017-11-06 MED ORDER — PREDNISONE 20 MG PO TABS: 20.0000 mg | ORAL_TABLET | Freq: Two times a day (BID) | ORAL | 0 refills | Status: AC

## 2017-11-06 NOTE — Patient Instructions (Signed)
Pleurisy Pleurisy, also called pleuritis, is irritation and swelling (inflammation) of the linings of the lungs. The linings of the lungs are called pleura. They cover the outside of the lungs and the inside of the chest wall. There is a small amount of fluid (pleural fluid) between the pleura that allows the lungs to move in and out smoothly when you breathe. Pleurisy causes the pleura to be rough and dry and to rub together when you breathe, which is painful. In some cases, pleurisy can cause pleural fluid to build up between the pleura (pleural effusion). What are the causes? Common causes of this condition include:  A lung infection caused by bacteria or a virus.  A blood clot that travels to the lung (pulmonary embolism).  Air leaking into the pleural space (pneumothorax).  Lung cancer or a lung tumor.  A chest injury.  Diseases that can cause lung inflammation. These include rheumatoid arthritis, lupus, sickle cell disease, inflammatory bowel disease, and pancreatitis.  Heart or chest surgery.  Lung damage from inhaling asbestos.  A lung reaction to certain medicines.  Sometimes the cause is unknown. What are the signs or symptoms? Chest pain is the main symptom of this condition. The pain is usually on one side. Chest pain may start suddenly and be sharp or stabbing. It may become a constant dull ache. You may also feel pain in your back or shoulder. The pain may get worse when you cough, take deep breaths, or make sudden movements. Other symptoms may include:  Shortness of breath.  Noisy breathing (wheezing).  Cough.  Chills.  Fever.  How is this diagnosed? This condition may be diagnosed based on:  Your medical history.  Your symptoms.  A physical exam. Your health care provider will listen to your breathing with a stethoscope to check for a rough, rubbing sound (friction rub). If you have pleural effusion, your breathing sounds may be muffled.  Tests, such  as: ? Blood tests to check for infections or diseases and to measure the oxygen in your blood. ? Imaging studies of your lungs. These may include a chest X-ray, ultrasound, MRI, or CT scan. ? A procedure to remove pleural fluid with a needle for testing (thoracentesis).  How is this treated? Treatment for this condition depends on the cause. Pleurisy that was caused by a virus usually clears up within 2 weeks. Treatment for pleurisy may include:  NSAIDs to help relieve pain and swelling.  Antibiotic medicines, if your condition was caused by a bacterial infection.  Prescription pain or cough medicine.  Medicines to dissolve a blood clot, if your condition was caused by pulmonary embolism.  Removal of pleural fluid or air.  Follow these instructions at home: Medicines  Take over-the-counter and prescription medicines only as told by your health care provider.  If you were prescribed an antibiotic, take it as told by your health care provider. Do not stop taking the antibiotic even if you start to feel better. Activity  Rest and return to your normal activities as told by your health care provider. Ask your health care provider what activities are safe for you.  Do not drive or use heavy machinery while taking prescription pain medicine. General instructions  Monitor your pleurisy for any changes.  Take deep breaths often, even if it is painful. This can help prevent lung infection (pneumonia) and collapse of lung tissue (atelectasis).  When lying down, lie on your painful side. This may reduce pain.  Do not smoke. If   you need help quitting, ask your health care provider.  Keep all follow-up visits as told by your health care provider. This is important. Contact a health care provider if:  You have pain that: ? Gets worse. ? Does not get better with medicine. ? Lasts for more than 1 week.  You have a fever or chills.  Your cough or shortness of breath is not improving  at home.  You cough up pus-like (purulent) secretions. Get help right away if:  Your lips, fingernails, or toenails darken or turn blue.  You cough up blood.  You have any of the following symptoms that get worse: ? Difficulty breathing. ? Shortness of breath. ? Wheezing.  You have pain that spreads into your neck, arms, or jaw.  You develop a rash.  You vomit.  You faint. Summary  Pleurisy is inflammation of the linings of the lungs (pleura).  Pleurisy causes pain that makes it difficult for you to breathe or cough.  Pleurisy is often caused by an underlying infection or disease.  Treatment of pleurisy depends on the cause, and it often includes medicines. This information is not intended to replace advice given to you by your health care provider. Make sure you discuss any questions you have with your health care provider. Document Released: 02/03/2005 Document Revised: 10/29/2015 Document Reviewed: 10/29/2015 Elsevier Interactive Patient Education  2017 [employer]vier [employer]  

## 2017-11-06 NOTE — Telephone Encounter (Signed)
-----   Message from Jerrol Banana., MD sent at 11/06/2017 10:11 AM EDT ----- Normal VQ scan--no PE .

## 2017-11-06 NOTE — Telephone Encounter (Signed)
That's fine if there are any appointment slots available

## 2017-11-06 NOTE — Progress Notes (Signed)
       Patient: Ariel Thompson Female    DOB: 1991/01/20   27 y.o.   MRN: [id number] Visit Date: 11/06/2017  Today's Provider: Lelon Huh, MD   Chief Complaint  Patient presents with  . Chest Pain   Subjective:    HPI Chest pain: Patient was seen yesterday for this problem by Dr. Rosanna Randy. Patient was given a Toradol injection in the office. D-dimer, chest x-ray and VQ scan was ordered and was negative. The pain is described as stabbing and occurs as when she inhales deeply. She only feels short of breath because it hurts to take in a deep breath. No fevers, chills, or sweats, but has had a little bit of wheezing today. The pain is located right upper chest. Is not affected by eating or drinking. States she has taken up to 3 OTC ibuprofen with very little improvement, but injection of ketorolac yesterday helped quite a bit, but wore off overnight.     Allergies  Allergen Reactions  . Hydrocodone-Acetaminophen     tachycardia, anxiety, hallucinations.     Current Outpatient Medications:  .  DULoxetine (CYMBALTA) 30 MG capsule, TAKE 1 CAPSULE BY MOUTH EVERY DAY, Disp: 90 capsule, Rfl: 3 .  Levonorgestrel (SKYLA) 13.5 MG IUD, by Intrauterine route., Disp: , Rfl:  .  Multiple Vitamin (MULTIVITAMIN) tablet, Take 1 tablet by mouth daily., Disp: , Rfl:   Current Facility-Administered Medications:  .  ketorolac (TORADOL) injection 60 mg, 60 mg, Intramuscular, Once, Jerrol Banana., MD  Review of Systems  Constitutional: Negative for appetite change, chills, fatigue and fever.  Respiratory: Negative for chest tightness and shortness of breath.   Cardiovascular: Positive for chest pain (right side). Negative for palpitations.  Gastrointestinal: Negative for abdominal pain, nausea and vomiting.  Neurological: Negative for dizziness and weakness.    Social History   Tobacco Use  . Smoking status: Never Smoker  . Smokeless tobacco: Never Used  Substance Use Topics  .  Alcohol use: Yes    Comment: occasional   Objective:    Vitals:   11/06/17 1611 11/06/17 1614  BP: 116/83 120/82  Pulse: 81   Resp: 16   Temp: 98.7 F (37.1 C)   TempSrc: Oral   SpO2: 99%   Weight: 205 lb (93 kg)      Physical Exam   General Appearance:    Alert, cooperative, no distress  Eyes:    PERRL, conjunctiva/corneas clear, EOM's intact       Lungs:     Clear to auscultation bilaterally, respirations unlabored, rare faint right expiratory wheeze.   Heart:    Regular rate and rhythm  Neurologic:   Awake, alert, oriented x 3. No apparent focal neurological           defect.   MS:   Mild  right upper anteriorchest wall tenderness       Assessment & Plan:     1. Pleurisy Had significantly pain relief with ketorolac injection yesterday, but OTC NSAIDs are not helping.  - predniSONE (DELTASONE) 20 MG tablet; Take 1 tablet (20 mg total) by mouth 2 (two) times daily with a meal for 7 days.  Dispense: 14 tablet; Refill: 0 - ketorolac (TORADOL) injection 60 mg  Call if symptoms change or if not rapidly improving.          Lelon Huh, MD  [facility]

## 2017-11-06 NOTE — Telephone Encounter (Signed)
Scheduled patient at 4:00 today.

## 2017-11-06 NOTE — Telephone Encounter (Signed)
-----   Message from Jerrol Banana., MD sent at 11/06/2017 10:10 AM EDT ----- CXR normal.

## 2017-11-06 NOTE — Telephone Encounter (Signed)
-----   Message from Jerrol Banana., MD sent at 11/06/2017 10:10 AM EDT ----- Labs all normal.

## 2017-11-06 NOTE — Telephone Encounter (Signed)
Patient was advised of all results. She reports that she was given a Toradol shot yesterday in the office, and it helped relieved the pain. She reports that the pain is still there on the right side of her chest. She is wanting to know if she can come in this afternoon and get another Toradol injection. Please advise. Thanks!

## 2018-04-09 NOTE — Telephone Encounter (Signed)
Encounter opened in error. KW 

## 2018-06-25 ENCOUNTER — Telehealth: Payer: Self-pay

## 2018-06-25 NOTE — Telephone Encounter (Signed)
LMTCB about PHQ screening

## 2018-09-19 ENCOUNTER — Other Ambulatory Visit: Payer: Self-pay | Admitting: Family Medicine

## 2018-09-19 DIAGNOSIS — F419 Anxiety disorder, unspecified: Secondary | ICD-10-CM

## 2018-12-12 ENCOUNTER — Other Ambulatory Visit: Payer: Self-pay | Admitting: Family Medicine

## 2018-12-12 DIAGNOSIS — F419 Anxiety disorder, unspecified: Secondary | ICD-10-CM

## 2018-12-22 ENCOUNTER — Other Ambulatory Visit: Payer: Self-pay | Admitting: *Deleted

## 2018-12-22 DIAGNOSIS — Z20822 Contact with and (suspected) exposure to covid-19: Secondary | ICD-10-CM

## 2018-12-23 LAB — NOVEL CORONAVIRUS, NAA: SARS-CoV-2, NAA: NOT DETECTED

## 2019-01-17 ENCOUNTER — Encounter: Payer: Self-pay | Admitting: Obstetrics and Gynecology

## 2019-01-17 ENCOUNTER — Other Ambulatory Visit (HOSPITAL_COMMUNITY)
Admission: RE | Admit: 2019-01-17 | Discharge: 2019-01-17 | Disposition: A | Discharge status: Home / Self Care | Payer: BC Managed Care – PPO | Source: Ambulatory Visit | Attending: Obstetrics and Gynecology | Admitting: Obstetrics and Gynecology

## 2019-01-17 ENCOUNTER — Ambulatory Visit (INDEPENDENT_AMBULATORY_CARE_PROVIDER_SITE_OTHER): Payer: BC Managed Care – PPO | Admitting: Obstetrics and Gynecology

## 2019-01-17 ENCOUNTER — Other Ambulatory Visit: Payer: Self-pay

## 2019-01-17 VITALS — BP 130/80 | Ht 65.0 in | Wt 205.0 lb

## 2019-01-17 DIAGNOSIS — Z23 Encounter for immunization: Secondary | ICD-10-CM

## 2019-01-17 DIAGNOSIS — Z124 Encounter for screening for malignant neoplasm of cervix: Secondary | ICD-10-CM

## 2019-01-17 DIAGNOSIS — Z1339 Encounter for screening examination for other mental health and behavioral disorders: Secondary | ICD-10-CM

## 2019-01-17 DIAGNOSIS — Z01419 Encounter for gynecological examination (general) (routine) without abnormal findings: Secondary | ICD-10-CM

## 2019-01-17 DIAGNOSIS — Z1331 Encounter for screening for depression: Secondary | ICD-10-CM

## 2019-01-17 DIAGNOSIS — Z30432 Encounter for removal of intrauterine contraceptive device: Secondary | ICD-10-CM

## 2019-01-17 LAB — HM PAP SMEAR: HM Pap smear: NORMAL

## 2019-01-17 NOTE — Progress Notes (Signed)
Gynecology Annual Exam  PCP: Jerrol Banana., MD  Chief Complaint  Patient presents with  . Gynecologic Exam  . Contraception    iud removal   History of Present Illness:  [name] is a 28 y.o. No obstetric history on file. who LMP was No LMP recorded. (Menstrual status: IUD)., presents today for her annual examination.  Her menses are absent with her Ariel Thompson IUD that was placed in 2016.  She is sexually active and is contemplating pregnancy in the next few months.   Last Pap: 1 year ago.  Results were: no abnormalities /neg HPV DNA not done. She has a history of an abnormal pap smear in 2016. The result was ASCUS (I did not see an HPV result.  She states that she believes the HPV result was negative). She states she has received the HPV vaccination series.  Hx of STDs: none  There is no FH of breast cancer. There is no FH of ovarian cancer. The patient does do self-breast exams yearly.  Tobacco use: The patient denies current or previous tobacco use. Alcohol use: social drinker Exercise: prior to hurting her foot.    The patient wears seatbelts: yes.   The patient reports that domestic violence in her life is absent.   Past Medical History:  Diagnosis Date  . Depression with anxiety     Past Surgical History:  Procedure Laterality Date  . TONSILLECTOMY  2010  . TYMPANOSTOMY TUBE PLACEMENT      Prior to Admission medications   Medication Sig Start Date End Date Taking? Authorizing Provider  DULoxetine (CYMBALTA) 30 MG capsule TAKE 1 CAPSULE BY MOUTH EVERY DAY 12/12/18  Yes Jerrol Banana., MD  Levonorgestrel [facility]) 13.5 MG IUD by Intrauterine route.   Yes [provider]  Multiple Vitamin (MULTIVITAMIN) tablet Take 1 tablet by mouth daily.   Yes [provider]    Allergies  Allergen Reactions  . Hydrocodone-Acetaminophen     tachycardia, anxiety, hallucinations.   Obstetric History: [id number]   Social History   Socioeconomic  History  . Marital status: Married    Spouse name: Not on file  . Number of children: Not on file  . Years of education: Not on file  . Highest education level: Not on file  Occupational History  . Not on file  Social Needs  . Financial resource strain: Not on file  . Food insecurity    Worry: Not on file    Inability: Not on file  . Transportation needs    Medical: Not on file    Non-medical: Not on file  Tobacco Use  . Smoking status: Never Smoker  . Smokeless tobacco: Never Used  Substance and Sexual Activity  . Alcohol use: Yes    Comment: occasional  . Drug use: No  . Sexual activity: Yes    Birth control/protection: I.U.D.  Lifestyle  . Physical activity    Days per week: Not on file    Minutes per session: Not on file  . Stress: Not on file  Relationships  . Social Herbalist on phone: Not on file    Gets together: Not on file    Attends religious service: Not on file    Active member of club or organization: Not on file    Attends meetings of clubs or organizations: Not on file    Relationship status: Not on file  . Intimate partner violence    Fear of  current or ex partner: Not on file    Emotionally abused: Not on file    Physically abused: Not on file    Forced sexual activity: Not on file  Other Topics Concern  . Not on file  Social History Narrative  . Not on file    Family History  Problem Relation Age of Onset  . Asthma Sister   . Migraines Sister   . Allergic rhinitis Sister   . Allergic rhinitis Brother   . Migraines Brother   . Heart Problems Brother        heart valve abnormality  . Mitral valve prolapse Mother   . Migraines Mother   . Other Mother        IBS  . Hypothyroidism Mother   . Depression Father     Review of Systems  Constitutional: Negative.   HENT: Negative.   Eyes: Negative.   Respiratory: Negative.   Cardiovascular: Negative.   Gastrointestinal: Negative.   Genitourinary: Negative.   Musculoskeletal:  Negative.   Skin: Negative.   Neurological: Negative.   Psychiatric/Behavioral: Negative.      Physical Exam BP 130/80   Ht 5\' 5"  (1.651 m)   Wt 205 lb (93 kg)   BMI 34.11 kg/m    Physical Exam Constitutional:      General: She is not in acute distress.    Appearance: Normal appearance. She is well-developed.  Genitourinary:     Pelvic exam was performed with patient in the lithotomy position.     Vulva, urethra, bladder and uterus normal.     No inguinal adenopathy present in the right or left side.    No signs of injury in the vagina.     No vaginal discharge, erythema, tenderness or bleeding.     No cervical motion tenderness, discharge, lesion or polyp.     IUD strings visualized.     Uterus is mobile.     Uterus is not enlarged or tender.     No uterine mass detected.    Uterus is anteverted.     No right or left adnexal mass present.     Right adnexa not tender or full.     Left adnexa not tender or full.  HENT:     Head: Normocephalic and atraumatic.  Eyes:     General: No scleral icterus.    Conjunctiva/sclera: Conjunctivae normal.  Neck:     Musculoskeletal: Normal range of motion and neck supple.     Thyroid: No thyromegaly.  Cardiovascular:     Rate and Rhythm: Normal rate and regular rhythm.     Heart sounds: No murmur. No friction rub. No gallop.   Pulmonary:     Effort: Pulmonary effort is normal. No respiratory distress.     Breath sounds: Normal breath sounds. No wheezing or rales.  Chest:     Breasts:        Right: No inverted nipple, mass, nipple discharge, skin change or tenderness.        Left: No inverted nipple, mass, nipple discharge, skin change or tenderness.  Abdominal:     General: Bowel sounds are normal. There is no distension.     Palpations: Abdomen is soft. There is no mass.     Tenderness: There is no abdominal tenderness. There is no guarding or rebound.  Musculoskeletal: Normal range of motion.        General: No swelling or  tenderness.  Lymphadenopathy:     Cervical: No cervical  adenopathy.     Lower Body: No right inguinal adenopathy. No left inguinal adenopathy.  Neurological:     General: No focal deficit present.     Mental Status: She is alert and oriented to person, place, and time.     Cranial Nerves: No cranial nerve deficit.  Skin:    General: Skin is warm and dry.     Findings: No erythema or rash.  Psychiatric:        Mood and Affect: Mood normal.        Behavior: Behavior normal.        Judgment: Judgment normal.    IUD Removal  Patient identified, informed consent performed, consent signed.  Patient was in the dorsal lithotomy position, normal external genitalia was noted.  A speculum was placed in the patient's vagina, normal discharge was noted, no lesions. The cervix was visualized, no lesions, no abnormal discharge.  The strings of the IUD were grasped and pulled using ring forceps. The IUD was removed in its entirety. Patient tolerated the procedure well.    Plans for pregnancy soon and she was told to avoid teratogens, take PNV and folic acid.  Routine preventative health maintenance measures emphasized.  Female chaperone present for pelvic and breast  portions of the physical exam  Results: AUDIT Questionnaire (screen for alcoholism): 3 PHQ-9: 1   Assessment: 28 y.o. No obstetric history on file. female here for routine annual gynecologic examination  Plan: Problem List Items Addressed This Visit    None    Visit Diagnoses    Women's annual routine gynecological examination    -  Primary   Relevant Orders   Cytology - PAP   Screening for depression       Screening for alcoholism       Pap smear for cervical cancer screening       Relevant Orders   Cytology - PAP   Encounter for IUD removal          Screening: -- Blood pressure screen normal -- Weight screening: obese: discussed management options, including lifestyle, dietary, and exercise. -- Depression screening  negative (PHQ-9) -- Nutrition: normal -- cholesterol screening: not due for screening -- osteoporosis screening: not due -- tobacco screening: not using -- alcohol screening: AUDIT questionnaire indicates low-risk usage. -- family history of breast cancer screening: done. not at high risk. -- no evidence of domestic violence or intimate partner violence. -- STD screening: gonorrhea/chlamydia NAAT not collected per patient request. -- pap smear collected per ASCCP guidelines -- flu vaccine received today -- HPV vaccination series: received  IUD removal. Patient plans for pregnancy.   Prentice Docker, MD 01/17/2019 2:25 PM

## 2019-01-19 LAB — CYTOLOGY - PAP: Diagnosis: NEGATIVE

## 2019-06-09 ENCOUNTER — Other Ambulatory Visit: Payer: Self-pay | Admitting: Family Medicine

## 2019-06-09 DIAGNOSIS — F419 Anxiety disorder, unspecified: Secondary | ICD-10-CM

## 2019-06-09 NOTE — Telephone Encounter (Signed)
Requested  medications are  due for refill today yes  Requested medications are on the active medication list yes  Last refill 1/29  Future visit scheduled no  Last visit 10/2017  Notes to clinic failed protocol due to no visit within 6 months

## 2019-06-11 ENCOUNTER — Emergency Department
Admission: EM | Admit: 2019-06-11 | Discharge: 2019-06-11 | Disposition: A | Discharge status: Home / Self Care | Payer: BC Managed Care – PPO | Attending: Student | Admitting: Student

## 2019-06-11 ENCOUNTER — Other Ambulatory Visit: Payer: Self-pay

## 2019-06-11 ENCOUNTER — Emergency Department: Payer: BC Managed Care – PPO

## 2019-06-11 DIAGNOSIS — R102 Pelvic and perineal pain: Secondary | ICD-10-CM | POA: Diagnosis not present

## 2019-06-11 DIAGNOSIS — N939 Abnormal uterine and vaginal bleeding, unspecified: Secondary | ICD-10-CM | POA: Insufficient documentation

## 2019-06-11 DIAGNOSIS — Z79899 Other long term (current) drug therapy: Secondary | ICD-10-CM | POA: Insufficient documentation

## 2019-06-11 DIAGNOSIS — N83201 Unspecified ovarian cyst, right side: Secondary | ICD-10-CM | POA: Diagnosis not present

## 2019-06-11 LAB — URINALYSIS, COMPLETE (UACMP) WITH MICROSCOPIC
Bacteria, UA: NONE SEEN
Bilirubin Urine: NEGATIVE
Glucose, UA: NEGATIVE mg/dL
Ketones, ur: NEGATIVE mg/dL
Leukocytes,Ua: NEGATIVE
Nitrite: NEGATIVE
Protein, ur: NEGATIVE mg/dL
Specific Gravity, Urine: 1.025 (ref 1.005–1.030)
pH: 6 (ref 5.0–8.0)

## 2019-06-11 LAB — CBC
HCT: 41 % (ref 36.0–46.0)
Hemoglobin: 14.3 g/dL (ref 12.0–15.0)
MCH: 29.4 pg (ref 26.0–34.0)
MCHC: 34.9 g/dL (ref 30.0–36.0)
MCV: 84.2 fL (ref 80.0–100.0)
Platelets: 414 10*3/uL — ABNORMAL HIGH (ref 150–400)
RBC: 4.87 MIL/uL (ref 3.87–5.11)
RDW: 12.2 % (ref 11.5–15.5)
WBC: 9.1 10*3/uL (ref 4.0–10.5)
nRBC: 0 % (ref 0.0–0.2)

## 2019-06-11 LAB — COMPREHENSIVE METABOLIC PANEL
ALT: 29 U/L (ref 0–44)
AST: 21 U/L (ref 15–41)
Albumin: 4.5 g/dL (ref 3.5–5.0)
Alkaline Phosphatase: 67 U/L (ref 38–126)
Anion gap: 11 (ref 5–15)
BUN: 13 mg/dL (ref 6–20)
CO2: 26 mmol/L (ref 22–32)
Calcium: 9.4 mg/dL (ref 8.9–10.3)
Chloride: 103 mmol/L (ref 98–111)
Creatinine, Ser: 0.83 mg/dL (ref 0.44–1.00)
GFR calc Af Amer: >60 mL/min (ref >60)
GFR calc non Af Amer: >60 mL/min (ref >60)
Glucose, Bld: 102 mg/dL — ABNORMAL HIGH (ref 70–99)
Potassium: 4.1 mmol/L (ref 3.5–5.1)
Sodium: 140 mmol/L (ref 135–145)
Total Bilirubin: 0.5 mg/dL (ref 0.3–1.2)
Total Protein: 8 g/dL (ref 6.5–8.1)

## 2019-06-11 LAB — HCG, QUANTITATIVE, PREGNANCY: hCG, Beta Chain, Quant, S: <1 m[IU]/mL (ref <5)

## 2019-06-11 LAB — LIPASE, BLOOD: Lipase: 32 U/L (ref 11–51)

## 2019-06-11 LAB — POCT PREGNANCY, URINE: Preg Test, Ur: NEGATIVE

## 2019-06-11 MED ORDER — SODIUM CHLORIDE 0.9% FLUSH: 3.0000 mL | Freq: Once | INTRAVENOUS | Status: DC

## 2019-06-11 NOTE — ED Triage Notes (Addendum)
Pt comes via POV from home with c/o vaginal bleeding and abdominal cramping. Pt states she had her IUD removed and had not had a period.  Pt states she started to have signs of pregnancy. Pt states yesterday she started bleeding heavily. Pt states she has went trough several pads and tampons.

## 2019-06-11 NOTE — ED Provider Notes (Signed)
[facility] Emergency Department Provider Note  ____________________________________________  Time seen: Approximately 6:09 PM  I have reviewed the triage vital signs and the nursing notes.   HISTORY  Chief Complaint Vaginal Bleeding and Abdominal Pain    HPI Ariel Thompson is a 29 y.o. female who presents the emergency department for evaluation of left-sided pelvic pain and vaginal bleeding.  Patient states that she had a long-term IUD that was removed approximately 6 months ago.  Patient states that she is not had a period since this removal.  She is being followed by her OB/GYN for this.  Approximately 3 weeks ago patient started having some nausea, vomiting, nipple sensitivity, mild abdominal cramps.  Patient has had several negative home pregnancy test.  Last night she began to have left-sided pelvic pain and significant vaginal bleeding.  She states that she is soaking more than 1 pad or tampon per hour.  Pain is in the left pelvic region, is described as aching sensation.  No nausea, vomiting.  No dysuria, polyuria, hematuria.  No diarrhea or constipation.  Patient contacted her OB/GYN and was referred to the emergency department for evaluation.         Past Medical History:  Diagnosis Date  . Depression with anxiety     Patient Active Problem List   Diagnosis Date Noted  . 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 Family planning 07/19/2015  . Cyst of ovary 07/19/2015  . Thrombocythemia ([place]) 07/19/2015  . Headache, migraine 12/18/2006  . Irregular bleeding 12/12/2006  . Allergic rhinitis 11/14/2005    Past Surgical History:  Procedure Laterality Date  . TONSILLECTOMY  2010  . TYMPANOSTOMY TUBE PLACEMENT      Prior to Admission medications   Medication Sig Start Date End Date Taking? Authorizing Provider  DULoxetine (CYMBALTA) 30 MG capsule TAKE 1 CAPSULE BY MOUTH EVERY DAY 12/12/18   Jerrol Banana., MD  Multiple Vitamin (MULTIVITAMIN) tablet Take 1 tablet by mouth daily.    [provider]    Allergies Hydrocodone-acetaminophen  Family History  Problem Relation Age of Onset  . Asthma Sister   . Migraines Sister   . Allergic rhinitis Sister   . Allergic rhinitis Brother   . Migraines Brother   . Heart Problems Brother        heart valve abnormality  . Mitral valve prolapse Mother   . Migraines Mother   . Other Mother        IBS  . Hypothyroidism Mother   . Depression Father     Social History Social History   Tobacco Use  . Smoking status: Never Smoker  . Smokeless tobacco: Never Used  Substance Use Topics  . Alcohol use: Yes    Comment: occasional  . Drug use: No     Review of Systems  Constitutional: No fever/chills Eyes: No visual changes. No discharge ENT: No upper respiratory complaints. Cardiovascular: no chest pain. Respiratory: no cough. No SOB. Gastrointestinal: No abdominal pain.  No nausea, no vomiting.  No diarrhea.  No constipation. Genitourinary: Negative for dysuria. No hematuria.  Heavy vaginal bleeding with left-sided pelvic pain Musculoskeletal: Negative for musculoskeletal pain. Skin: Negative for rash, abrasions, lacerations, ecchymosis. Neurological: Negative for headaches, focal weakness or numbness. 10-point ROS otherwise negative.  ____________________________________________   PHYSICAL EXAM:  VITAL SIGNS: ED Triage Vitals [06/11/19 1717]  Enc Vitals Group     BP (!) 157/105     Pulse Rate (!) 108  Resp 19     Temp 98.4 F (36.9 C)     Temp src      SpO2 100 %     Weight 205 lb (93 kg)     Height 5\' 5"  (1.651 m)     Head Circumference      Peak Flow      Pain Score 6     Pain Loc      Pain Edu?      Excl. in [place]?      Constitutional: Alert and oriented. Well appearing and in no acute distress. Eyes: Conjunctivae are normal. PERRL. EOMI. Head: Atraumatic. ENT:      Ears:       Nose: No  congestion/rhinnorhea.      Mouth/Throat: Mucous membranes are moist.  Neck: No stridor.    Cardiovascular: Normal rate, regular rhythm. Normal S1 and S2.  Good peripheral circulation. Respiratory: Normal respiratory effort without tachypnea or retractions. Lungs CTAB. Good air entry to the bases with no decreased or absent breath sounds. Gastrointestinal: Bowel sounds 4 quadrants.  Soft to palpation all quadrants.  Patient is mildly tender to palpation in the left lower lateral abdominal wall.  No tenderness to palpation.  No guarding or rigidity. No palpable masses. No distention. No CVA tenderness. Musculoskeletal: Full range of motion to all extremities. No gross deformities appreciated. Neurologic:  Normal speech and language. No gross focal neurologic deficits are appreciated.  Skin:  Skin is warm, dry and intact. No rash noted. Psychiatric: Mood and affect are normal. Speech and behavior are normal. Patient exhibits appropriate insight and judgement.   ____________________________________________   LABS (all labs ordered are listed, but only abnormal results are displayed)  Labs Reviewed  COMPREHENSIVE METABOLIC PANEL - Abnormal; Notable for the following components:      Result Value   Glucose, Bld 102 (*)    All other components within normal limits  CBC - Abnormal; Notable for the following components:   Platelets 414 (*)    All other components within normal limits  URINALYSIS, COMPLETE (UACMP) WITH MICROSCOPIC - Abnormal; Notable for the following components:   Hgb urine dipstick MODERATE (*)    All other components within normal limits  LIPASE, BLOOD  HCG, QUANTITATIVE, PREGNANCY  POC URINE PREG, ED  POCT PREGNANCY, URINE   ____________________________________________  EKG   ____________________________________________  RADIOLOGY I personally viewed and evaluated these images as part of my medical decision making, as well as reviewing the written report by the  radiologist.  US PELVIC COMPLETE W TRANSVAGINAL AND TORSION R/O  Result Date: 06/11/2019 CLINICAL DATA:  IUD removed November 2020. Left greater than right pelvic pain. Vaginal bleeding for 2 days. EXAM: TRANSABDOMINAL AND TRANSVAGINAL ULTRASOUND OF PELVIS DOPPLER ULTRASOUND OF OVARIES TECHNIQUE: Both transabdominal and transvaginal ultrasound examinations of the pelvis were performed. Transabdominal technique was performed for global imaging of the pelvis including uterus, ovaries, adnexal regions, and pelvic cul-de-sac. It was necessary to proceed with endovaginal exam following the transabdominal exam to visualize the endometrium and ovaries. Color and duplex Doppler ultrasound was utilized to evaluate blood flow to the ovaries. COMPARISON:  None. FINDINGS: Uterus Measurements: 6.7 x 3.4 x 4.4 cm = volume: 51.3 mL. Shadowing projected over the fundus on several views. No focal mass or suspicious finding. The shadowing may be technical in nature. Endometrium Thickness: 8 mm.  No focal abnormality visualized. Right ovary Measurements: 4 x 3 x 2.6 cm = volume: 16.2 mL. Hypoechoic region in the right  ovary measuring up to 1.9 cm. Left ovary Measurements: 4.5 x 1.9 x 2.4 cm = volume: 10.6 mL. Normal appearance/no adnexal mass. Pulsed Doppler evaluation of both ovaries demonstrates normal low-resistance arterial and venous waveforms. Other findings No abnormal free fluid. IMPRESSION: 1. The 1.9 cm hypoechoic region in the right ovary likely a hemorrhagic cyst. 2. The endometrial thickness is normal for age. No cause for bleeding identified. If bleeding remains unresponsive to hormonal or medical therapy, sonohysterogram should be considered for focal lesion work-up. (Ref: Radiological Reasoning: Algorithmic Workup of Abnormal Vaginal Bleeding with Endovaginal Sonography and Sonohysterography. AJR 2008; [id number]) 3. No other significant abnormalities. Electronically Signed   By: Dorise Bullion III M.D   On:  06/11/2019 20:17    ____________________________________________    PROCEDURES  Procedure(s) performed:    Procedures    Medications  sodium chloride flush (NS) 0.9 % injection 3 mL (has no administration in time range)     ____________________________________________   INITIAL IMPRESSION / ASSESSMENT AND PLAN / ED COURSE  Pertinent labs & imaging results that were available during my care of the patient were reviewed by me and considered in my medical decision making (see chart for details).  Review of the [place] CSRS was performed in accordance of the [facility] prior to dispensing any controlled drugs.           Patient's diagnosis is consistent with vaginal bleeding, right ovarian cyst.  Patient presented to the emergency department complaining of heavy vaginal bleeding x2 days.  Patient states that she had an IUD removed last November but had not had a return of her menstrual cycle.  Patient had had a few other symptoms of nausea, vomiting, nipple sensitivity earlier in the month prior to the onset of the symptoms.  Patient was advised by her OB/GYN to present to the emergency department for evaluation.  Exam was reassuring.  Patient's labs are reassuring and she was not pregnant.  Patient was evaluated with ultrasound which revealed no acute findings to cause vaginal bleeding.  Incidental finding of ovarian cyst on the right side.  Patient was offered hormonal therapy such as Depo pills for vaginal bleeding.  Patient and I feel that this is likely the return of her menstrual cycle and she does not want to alternate with further hormonal medications.  I feel this is very reasonable.  Patient is stable at this time.  Patient will follow up with her OB/GYN on Monday.  If symptoms worsen, do not improve or there is other concerns she may always return to the emergency department. Patient is given ED precautions to return to the ED for any worsening or new  symptoms.     ____________________________________________  FINAL CLINICAL IMPRESSION(S) / ED DIAGNOSES  Final diagnoses:  Vaginal bleeding  Cyst of right ovary      NEW MEDICATIONS STARTED DURING THIS VISIT:  ED Discharge Orders    None          This chart was dictated using voice recognition software/Dragon. Despite best efforts to proofread, errors can occur which can change the meaning. Any change was purely unintentional.    Darletta Moll, PA-C 06/11/19 2118    Lilia Pro., MD 06/12/19 [phone]

## 2019-09-09 ENCOUNTER — Other Ambulatory Visit: Payer: Self-pay | Admitting: Family Medicine

## 2019-09-09 DIAGNOSIS — F419 Anxiety disorder, unspecified: Secondary | ICD-10-CM

## 2019-09-09 NOTE — Telephone Encounter (Signed)
Requested  medications are  due for refill today yes  Requested medications are on the active medication list yes  Last refill 1/29  Last visit 10/2017  Future visit scheduled NO  Notes to clinic Failed protocol due to no visit within 6 months

## 2019-09-12 ENCOUNTER — Encounter: Payer: Self-pay | Admitting: Family Medicine

## 2019-09-19 ENCOUNTER — Ambulatory Visit: Payer: BC Managed Care – PPO | Admitting: Family Medicine

## 2019-09-19 ENCOUNTER — Other Ambulatory Visit: Payer: Self-pay

## 2019-09-19 ENCOUNTER — Encounter: Payer: Self-pay | Admitting: Family Medicine

## 2019-09-19 VITALS — BP 129/84 | HR 97 | Temp 98.4°F | Resp 14 | Ht 65.0 in | Wt 207.0 lb

## 2019-09-19 DIAGNOSIS — F4322 Adjustment disorder with anxiety: Secondary | ICD-10-CM

## 2019-09-19 DIAGNOSIS — N926 Irregular menstruation, unspecified: Secondary | ICD-10-CM | POA: Diagnosis not present

## 2019-09-19 MED ORDER — DULOXETINE HCL 20 MG PO CPEP: 20.0000 mg | ORAL_CAPSULE | Freq: Every day | ORAL | 11 refills | Status: DC

## 2019-09-19 NOTE — Progress Notes (Signed)
Established patient visit  I,[name],acting as a scribe for Wilhemena Durie, MD.,have documented all relevant documentation on the behalf of Wilhemena Durie, MD,as directed by  Wilhemena Durie, MD while in the presence of Wilhemena Durie, MD.   Patient: Ariel Thompson   DOB: 11-28-1990   29 y.o. Female  MRN: [id number] Visit Date: 09/19/2019  Today's healthcare provider: Wilhemena Durie, MD   Chief Complaint  Patient presents with   Follow-up   Subjective    HPI  Patient has been married now for 4 years almost.  She is doing well.  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  She is seeing Dr. Glennon Mac at [facility] and he took out a ride to ED in November of last year.  She has had a regular menses since then but always has had.  She and her husband would like to get pregnant in the future. Her anxiety is stable.  We have cut her back from duloxetine 60-30 and she would possibly like to consider getting off of it. She is little bit anxious about the upcoming school year.  Covid is also on the rebound.  She and her husband have both had a Covid vaccines.  She feels anxious but not depressed. On PHQ-9 she scores a 6.  On GAD-7 she scores 11. Anxiety, Follow-up  She was last seen for anxiety 11/05/2017. Changes made at last visit include; no changes were made. She reports good compliance with treatment. She reports good tolerance of treatment. She is not having side effects. none  She feels her anxiety is mild and Unchanged since last visit.  Symptoms: No chest pain No difficulty concentrating  No dizziness Yes fatigue  No feelings of losing control Yes insomnia  No irritable No palpitations  No panic attacks Yes racing thoughts  No shortness of breath Yes sweating  No tremors/shakes    GAD-7 Results GAD-7 Generalized Anxiety Disorder Screening Tool 09/19/2019  1. Feeling Nervous,  Anxious, or on Edge 2  2. Not Being Able to Stop or Control Worrying 1  3. Worrying Too Much About Different Things 2  4. Trouble Relaxing 2  5. Being So Restless it's Hard To Sit Still 3  6. Becoming Easily Annoyed or Irritable 1  7. Feeling Afraid As If Something Awful Might Happen 0  Total GAD-7 Score 11  Difficulty At Work, Home, or Getting  Along With Others? Not difficult at all    PHQ-9 Scores PHQ9 SCORE ONLY 09/19/2019 10/02/2016  PHQ-9 Total Score 6 9    -------------------------------------------------------------------       Medications: Outpatient Medications Prior to Visit  Medication Sig   DULoxetine (CYMBALTA) 30 MG capsule TAKE 1 CAPSULE BY MOUTH EVERY DAY   Prenatal MV & Min w/FA-DHA (PRENATAL ADULT GUMMY/DHA/FA PO) Take by mouth.   [DISCONTINUED] Multiple Vitamin (MULTIVITAMIN) tablet Take 1 tablet by mouth daily.   No facility-administered medications prior to visit.    Review of Systems  Constitutional: Negative for appetite change, chills, fatigue and fever.  Respiratory: Negative for chest tightness and shortness of breath.   Cardiovascular: Negative for chest pain and palpitations.  Gastrointestinal: Negative for abdominal pain, nausea and vomiting.  Neurological: Negative for dizziness and weakness.       Objective    BP 129/84 (BP Location: Right Arm, Patient Position: Sitting, Cuff Size: Large)    Pulse 97  Temp 98.4 F (36.9 C) (Oral)    Resp 14    Ht 5\' 5"  (1.651 m)    Wt 207 lb (93.9 kg)    SpO2 97%    BMI 34.45 kg/m  BP Readings from Last 3 Encounters:  09/19/19 129/84  06/11/19 (!) 127/98  01/17/19 130/80      Physical Exam Vitals reviewed.  Constitutional:      Appearance: She is well-developed.  HENT:     Head: Normocephalic and atraumatic.  Cardiovascular:     Rate and Rhythm: Normal rate and regular rhythm.     Pulses: Normal pulses.  Pulmonary:     Effort: Pulmonary effort is normal.     Breath sounds: Normal breath  sounds.  Abdominal:     Palpations: Abdomen is soft.  Musculoskeletal:     Right lower leg: Normal.     Left lower leg: Normal.  Skin:    General: Skin is warm and dry.  Neurological:     General: No focal deficit present.     Mental Status: She is alert and oriented to person, place, and time.  Psychiatric:        Mood and Affect: Mood normal.        Behavior: Behavior normal.        Thought Content: Thought content normal.        Judgment: Judgment normal.       No results found for any visits on 09/19/19.  Assessment & Plan     1. Adjustment reaction with anxiety Decrease duloxetine to 20 mg and we will see how she  handles the fall semester of going back to school.  2. Irregular bleeding Followed by GYN.  They are attempting to conceive. Dr. Glennon Mac agrees if she needs a medicine she should be on it.  Would like to get her off of it if possible.  Do this in the fall with possible   No follow-ups on file.         Vicky Mccanless Cranford Mon, MD  [facility] [phone] (phone) [phone] (fax)  [place]

## 2019-10-11 ENCOUNTER — Other Ambulatory Visit: Payer: Self-pay | Admitting: Family Medicine

## 2019-11-21 ENCOUNTER — Ambulatory Visit (INDEPENDENT_AMBULATORY_CARE_PROVIDER_SITE_OTHER): Payer: BC Managed Care – PPO | Admitting: Obstetrics and Gynecology

## 2019-11-21 ENCOUNTER — Other Ambulatory Visit: Payer: Self-pay

## 2019-11-21 ENCOUNTER — Encounter: Payer: Self-pay | Admitting: Obstetrics and Gynecology

## 2019-11-21 VITALS — BP 122/70 | Ht 65.0 in | Wt 198.0 lb

## 2019-11-21 DIAGNOSIS — N970 Female infertility associated with anovulation: Secondary | ICD-10-CM

## 2019-11-21 LAB — POCT URINE PREGNANCY: Preg Test, Ur: NEGATIVE

## 2019-11-21 MED ORDER — MEDROXYPROGESTERONE ACETATE 10 MG PO TABS: 10.0000 mg | ORAL_TABLET | Freq: Every day | ORAL | 0 refills | Status: DC

## 2019-11-21 NOTE — Progress Notes (Signed)
Obstetrics & Gynecology Office Visit   Chief Complaint  Patient presents with  . Infertility   History of Present Illness:  Patient is a 29 y.o. G0P0000 presenting for evaluation of infertility. Patient and partner have been attempting conception for 11 months. Marital Status: married. Pregnancies with current partner no  Sexual History Frequency: 10 times per month Dyspareunia: no Use of Lubricant: no Douching: no  Ovulatory Evaluation LMP: Patient's last menstrual period was 10/04/2019. Menarche:13 Interval: months at a time Duration of flow: a variable number of days In April she bled for two weeks, in August she bled for one week Heavy Menses: yes Clots: yes Ovulation Predictor Kits Positive  no Amenorrhea: yes. she will go months at a time without a period Wt Change: lost about 13 pounds since August.  Also, she is not eating carbs or sugar. In 2017-2018 she gained about 20-40 pounds.  She has seen and endocrinologist and no issues have been found.  Hirsutism: no Balding: no Acne: yes, has a history of this. Galactorrhea: no Hot Flashes: no Meds: no Other Therapies: no  Tubal Factor Previous abdominal or pelvic surgery: no Pelvic Pain:  no Endometriosis: no STD: no PID: no Laparoscopy: no Prior HSG: no  Female Factor Sired prior conception:  no Semen analysis: no  Contraception Skyla removed in 12/2018. She has used combined OCPs in high school to help with he rmenses and ovarian cyst.   Contributing Habits Cigarettes:    Wife -  no    Husband - no Alcohol:    Wife -  occasional    Husband - 1-2 beers/night Marijuana: never  Past Medical History:  Diagnosis Date  . Depression with anxiety    Past Surgical History:  Procedure Laterality Date  . TONSILLECTOMY  2010  . TYMPANOSTOMY TUBE PLACEMENT     Gynecologic History: Patient's last menstrual period was 10/04/2019.  Obstetric History: G0P0000  Family History  Problem Relation Age of  Onset  . Asthma Sister   . Migraines Sister   . Allergic rhinitis Sister   . Allergic rhinitis Brother   . Migraines Brother   . Heart Problems Brother        heart valve abnormality  . Mitral valve prolapse Mother   . Migraines Mother   . Other Mother        IBS  . Hypothyroidism Mother   . Depression Father     Social History   Socioeconomic History  . Marital status: Married    Spouse name: Not on file  . Number of children: Not on file  . Years of education: Not on file  . Highest education level: Not on file  Occupational History  . Not on file  Tobacco Use  . Smoking status: Never Smoker  . Smokeless tobacco: Never Used  Vaping Use  . Vaping Use: Never used  Substance and Sexual Activity  . Alcohol use: Yes    Comment: occasional  . Drug use: No  . Sexual activity: Yes  Other Topics Concern  . Not on file  Social History Narrative  . Not on file   Social Determinants of Health   Financial Resource Strain:   . Difficulty of Paying Living Expenses: Not on file  Food Insecurity:   . Worried About Charity fundraiser in the Last Year: Not on file  . Ran Out of Food in the Last Year: Not on file  Transportation Needs:   . Lack of Transportation (Medical): Not on  file  . Lack of Transportation (Non-Medical): Not on file  Physical Activity:   . Days of Exercise per Week: Not on file  . Minutes of Exercise per Session: Not on file  Stress:   . Feeling of Stress : Not on file  Social Connections:   . Frequency of Communication with Friends and Family: Not on file  . Frequency of Social Gatherings with Friends and Family: Not on file  . Attends Religious Services: Not on file  . Active Member of Clubs or Organizations: Not on file  . Attends Archivist Meetings: Not on file  . Marital Status: Not on file  Intimate Partner Violence:   . Fear of Current or Ex-Partner: Not on file  . Emotionally Abused: Not on file  . Physically Abused: Not on file   . Sexually Abused: Not on file    Allergies  Allergen Reactions  . Hydrocodone-Acetaminophen     tachycardia, anxiety, hallucinations.    Prior to Admission medications   Medication Sig Start Date End Date Taking? Authorizing Provider  DULoxetine (CYMBALTA) 20 MG capsule Take 1 capsule (20 mg total) by mouth daily. 09/19/19  Yes Jerrol Banana., MD  Prenatal MV & Min w/FA-DHA (PRENATAL ADULT GUMMY/DHA/FA PO) Take by mouth.   Yes [provider]    Review of Systems  Constitutional: Negative.   HENT: Negative.   Eyes: Negative.   Respiratory: Negative.   Cardiovascular: Negative.   Gastrointestinal: Negative.   Genitourinary: Negative.   Musculoskeletal: Negative.   Skin: Negative.   Neurological: Negative.   Psychiatric/Behavioral: Negative.      Physical Exam BP 122/70   Ht 5\' 5"  (1.651 m)   Wt 198 lb (89.8 kg)   LMP 10/04/2019   BMI 32.95 kg/m  Patient's last menstrual period was 10/04/2019. Physical Exam Constitutional:      General: She is not in acute distress.    Appearance: Normal appearance.  HENT:     Head: Normocephalic and atraumatic.  Eyes:     General: No scleral icterus.    Conjunctiva/sclera: Conjunctivae normal.  Neurological:     General: No focal deficit present.     Mental Status: She is alert and oriented to person, place, and time.     Cranial Nerves: No cranial nerve deficit.  Psychiatric:        Mood and Affect: Mood normal.        Behavior: Behavior normal.        Judgment: Judgment normal.     Female chaperone present for pelvic and breast  portions of the physical exam  UPT: negative  Assessment: 29 y.o. G0P0000 female here for  1. Infertility associated with anovulation      Plan: Problem List Items Addressed This Visit    None    Visit Diagnoses    Infertility associated with anovulation    -  Primary   Relevant Orders   POCT urine pregnancy (Completed)     We discussed the underlying etiologies  which may be implicated in a couple experiencing difficulty conceiving.  The average couple will conceive within the span of 1 year with unprotected coitus, with a monthly fecundity rate of 20% or 1 in 5.  Even without further work up or intervention the patient and her partner may be successful in conceiving unassisted, although if an underlying etiology can be identified and addressed fecundity rate may improve.  The work up entails examining for ovulatory function, tubal patency, and  ruling out female factor infertility.  These may be looked at concurrently or sequentially.  The downside of sequential work up is that this method may miss issues if more than one compartment is contributing.  She is aware that tubal factor or moderate to severe female factor infertility will require further consultation with a reproductive endocrinologist.  In the case of anovulation, use of Clomid (clomiphen citrate) or Femara (letrazole) were discussed with the understanding the the later is an off-label, but well supported use.  Current recommendation are to use letrozole first line secondary to increased live birth rate compared to clomid for patients with PCOS ("Polycystic Ovarian Syndrome" ACOG Practice Bulletin 194 June 2018).  With either of these drugs the risk of multiples increases from the standard population rate of 2% to approximately 10%, with higher order multiples possible but unlikely.  Both drugs may require some time to titrate to the appropriate dosage to ensure consistent ovulation.  Cycles will be limited to 6 cycles on each drug secondary to decreasing rates of conception after 6 cycles.  In addition should patient be started on ovulation induction with Clomid she was advised to discontinue the drug for any vision changes as this is a rare but potentially permanent side-effect if medication is continued.  Was counseled that letrozole may cause idiopathic bone pain that generally resolves.  Hot flashes,  headaches, and nausea were mentioned as the most commonly encountered side-effects of both drugs.  We discussed timing of intercourse as well as the use of ovulation predictor kits identify the patient's fertile window each month.     Will start with ovulation induction.  Will force a menses with provera.  She will let me know when her period starts.   A total of 35 minutes were spent face-to-face with the patient as well as preparation, review, communication, and documentation during this encounter.    Prentice Docker, MD 11/21/2019 1:35 PM

## 2019-11-25 NOTE — Progress Notes (Deleted)
     Established patient visit   Patient: Ariel Thompson   DOB: [birth year]   29 y.o. Female  MRN: [id number] Visit Date: 11/28/2019  Today's healthcare provider: Wilhemena Durie, MD   Chief Complaint  Patient presents with  . Anxiety   Subjective    HPI  Adjustment reaction with anxiety From 09/19/2019-Decreased duloxetine to 20 mg and we will see how she  handles the fall semester of going back to school.   {Show patient history (optional):23778::" "}   Medications: Outpatient Medications Prior to Visit  Medication Sig  . DULoxetine (CYMBALTA) 20 MG capsule Take 1 capsule (20 mg total) by mouth daily.  . medroxyPROGESTERone (PROVERA) 10 MG tablet Take 1 tablet (10 mg total) by mouth daily for 10 days.  . Prenatal MV & Min w/FA-DHA (PRENATAL ADULT GUMMY/DHA/FA PO) Take by mouth.   No facility-administered medications prior to visit.    Review of Systems  Constitutional: Negative for appetite change, chills, fatigue and fever.  Respiratory: Negative for chest tightness and shortness of breath.   Cardiovascular: Negative for chest pain and palpitations.  Gastrointestinal: Negative for abdominal pain, nausea and vomiting.  Neurological: Negative for dizziness and weakness.    {Heme  Chem  Endocrine  Serology  Results Review (optional):23779::" "}  Objective    There were no vitals taken for this visit. {Show previous vital signs (optional):23777::" "}  Physical Exam  ***  No results found for any visits on 11/28/19.  Assessment & Plan     ***  No follow-ups on file.      {provider attestation***:1}   Wilhemena Durie, MD  [facility] [phone] (phone) [phone] (fax)  [place]

## 2019-11-28 ENCOUNTER — Encounter: Payer: BC Managed Care – PPO | Admitting: Family Medicine

## 2019-11-28 ENCOUNTER — Encounter: Payer: Self-pay | Admitting: Family Medicine

## 2019-11-28 ENCOUNTER — Telehealth: Payer: Self-pay

## 2019-11-28 NOTE — Telephone Encounter (Signed)
Copied from [place] [id number]. Topic: General - Other >> Nov 28, 2019 12:28 PM Ariel Thompson wrote: Reason for CRM: Pt stated she would like to change her appt for today to a virtual visit. Pt requests call back

## 2019-11-29 NOTE — Telephone Encounter (Signed)
Done

## 2019-12-05 ENCOUNTER — Other Ambulatory Visit: Payer: Self-pay | Admitting: Obstetrics and Gynecology

## 2019-12-05 DIAGNOSIS — N970 Female infertility associated with anovulation: Secondary | ICD-10-CM

## 2019-12-05 MED ORDER — CLOMIPHENE CITRATE 50 MG PO TABS: 50.0000 mg | ORAL_TABLET | Freq: Every day | ORAL | 0 refills | Status: AC

## 2019-12-09 ENCOUNTER — Other Ambulatory Visit: Payer: Self-pay | Admitting: Obstetrics and Gynecology

## 2019-12-09 DIAGNOSIS — N970 Female infertility associated with anovulation: Secondary | ICD-10-CM

## 2019-12-22 ENCOUNTER — Other Ambulatory Visit: Payer: Self-pay | Admitting: Family Medicine

## 2019-12-26 ENCOUNTER — Other Ambulatory Visit: Payer: BC Managed Care – PPO

## 2019-12-26 ENCOUNTER — Other Ambulatory Visit: Payer: Self-pay

## 2019-12-26 DIAGNOSIS — N970 Female infertility associated with anovulation: Secondary | ICD-10-CM

## 2019-12-27 LAB — PROGESTERONE: Progesterone: 0.1 ng/mL

## 2020-01-11 ENCOUNTER — Other Ambulatory Visit: Payer: Self-pay | Admitting: Obstetrics and Gynecology

## 2020-01-11 DIAGNOSIS — N970 Female infertility associated with anovulation: Secondary | ICD-10-CM

## 2020-01-11 MED ORDER — MEDROXYPROGESTERONE ACETATE 10 MG PO TABS: 10.0000 mg | ORAL_TABLET | Freq: Every day | ORAL | 0 refills | Status: DC

## 2020-01-30 ENCOUNTER — Other Ambulatory Visit: Payer: Self-pay | Admitting: Obstetrics and Gynecology

## 2020-01-30 DIAGNOSIS — N970 Female infertility associated with anovulation: Secondary | ICD-10-CM

## 2020-01-30 MED ORDER — CLOMIPHENE CITRATE 50 MG PO TABS: 100.0000 mg | ORAL_TABLET | Freq: Every day | ORAL | 0 refills | Status: AC

## 2020-02-15 ENCOUNTER — Other Ambulatory Visit: Payer: Self-pay | Admitting: Obstetrics & Gynecology

## 2020-02-15 ENCOUNTER — Telehealth: Payer: Self-pay

## 2020-02-15 DIAGNOSIS — N970 Female infertility associated with anovulation: Secondary | ICD-10-CM

## 2020-02-15 NOTE — Telephone Encounter (Signed)
Can you get pt on the lab schedule for tomorrow please?

## 2020-02-15 NOTE — Telephone Encounter (Signed)
Pt needs to come in for blood draw, on second cycle of Clomid. Her day 21 is on Friday and we are closed. Can she come in tomorrow? Will this effect the results at all? If so, could you possibly put in order for me since SDJ not in office today please? Ill call the pt back

## 2020-02-15 NOTE — Telephone Encounter (Signed)
Patient is scheduled for 9 am 02/16/20

## 2020-02-16 ENCOUNTER — Other Ambulatory Visit: Payer: BC Managed Care – PPO

## 2020-02-16 ENCOUNTER — Other Ambulatory Visit: Payer: Self-pay

## 2020-02-16 DIAGNOSIS — N970 Female infertility associated with anovulation: Secondary | ICD-10-CM

## 2020-02-17 LAB — PROGESTERONE: Progesterone: 2 ng/mL

## 2020-02-18 NOTE — L&D Delivery Note (Signed)
Delivery Note Primary OB: [facility] Delivery Physician: Barnett Applebaum, MD Gestational Age: Full term Antepartum complications: none Intrapartum complications: None  A viable Female was delivered via vertex presentation. "Ariel Thompson" Apgars:8 ,9  Weight:  pending .   Placenta status: spontaneous and Intact.  Cord: 3+ vessels;  with the following complications: none.  Anesthesia:  epidural Episiotomy:  none Lacerations:  2nd Suture Repair: 2.0 vicryl Est. Blood Loss (mL):  200 mL  Mom to postpartum.  Baby to Couplet care / Skin to Skin.  Barnett Applebaum, MD, [facility], [facility] 12/23/2020  4:41 PM [phone]

## 2020-04-06 ENCOUNTER — Other Ambulatory Visit: Payer: Self-pay | Admitting: Obstetrics and Gynecology

## 2020-04-06 DIAGNOSIS — N970 Female infertility associated with anovulation: Secondary | ICD-10-CM

## 2020-04-06 MED ORDER — MEDROXYPROGESTERONE ACETATE 10 MG PO TABS: 10.0000 mg | ORAL_TABLET | Freq: Every day | ORAL | 0 refills | Status: DC

## 2020-04-30 ENCOUNTER — Telehealth: Payer: Self-pay

## 2020-04-30 NOTE — Telephone Encounter (Signed)
Patient is scheduled for NOB visit on Thursday. Patient has bad acid reflux and wants to know what she should take. 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

## 2020-04-30 NOTE — Telephone Encounter (Signed)
Adv pt can use Tums, Rolaids, Nexium, Mylanta; avoid fatty, fried, or spicy foods; eat small freq meals; elevate HOB 4-6in; lay on right side b/c stomach empties out on the right.

## 2020-05-03 ENCOUNTER — Other Ambulatory Visit: Payer: Self-pay

## 2020-05-03 ENCOUNTER — Ambulatory Visit (INDEPENDENT_AMBULATORY_CARE_PROVIDER_SITE_OTHER): Payer: BC Managed Care – PPO | Admitting: Obstetrics and Gynecology

## 2020-05-03 ENCOUNTER — Encounter: Payer: Self-pay | Admitting: Obstetrics and Gynecology

## 2020-05-03 ENCOUNTER — Other Ambulatory Visit (HOSPITAL_COMMUNITY)
Admission: RE | Admit: 2020-05-03 | Discharge: 2020-05-03 | Disposition: A | Discharge status: Home / Self Care | Payer: BC Managed Care – PPO | Source: Ambulatory Visit | Attending: Obstetrics and Gynecology | Admitting: Obstetrics and Gynecology

## 2020-05-03 VITALS — BP 128/82 | HR 98 | Wt 208.0 lb

## 2020-05-03 DIAGNOSIS — F419 Anxiety disorder, unspecified: Secondary | ICD-10-CM

## 2020-05-03 DIAGNOSIS — Z113 Encounter for screening for infections with a predominantly sexual mode of transmission: Secondary | ICD-10-CM | POA: Diagnosis not present

## 2020-05-03 DIAGNOSIS — Z7185 Encounter for immunization safety counseling: Secondary | ICD-10-CM

## 2020-05-03 DIAGNOSIS — N926 Irregular menstruation, unspecified: Secondary | ICD-10-CM | POA: Diagnosis not present

## 2020-05-03 DIAGNOSIS — Z6834 Body mass index (BMI) 34.0-34.9, adult: Secondary | ICD-10-CM

## 2020-05-03 DIAGNOSIS — O219 Vomiting of pregnancy, unspecified: Secondary | ICD-10-CM

## 2020-05-03 DIAGNOSIS — O281 Abnormal biochemical finding on antenatal screening of mother: Secondary | ICD-10-CM

## 2020-05-03 DIAGNOSIS — F3289 Other specified depressive episodes: Secondary | ICD-10-CM

## 2020-05-03 DIAGNOSIS — Z3401 Encounter for supervision of normal first pregnancy, first trimester: Secondary | ICD-10-CM

## 2020-05-03 DIAGNOSIS — Z3A09 9 weeks gestation of pregnancy: Secondary | ICD-10-CM | POA: Diagnosis not present

## 2020-05-03 DIAGNOSIS — Z3149 Encounter for other procreative investigation and testing: Secondary | ICD-10-CM

## 2020-05-03 LAB — POCT URINE PREGNANCY: Preg Test, Ur: POSITIVE — AB

## 2020-05-03 LAB — OB RESULTS CONSOLE VARICELLA ZOSTER ANTIBODY, IGG: Varicella: IMMUNE

## 2020-05-03 MED ORDER — DOXYLAMINE-PYRIDOXINE 10-10 MG PO TBEC: 2.0000 | DELAYED_RELEASE_TABLET | Freq: Every day | ORAL | 5 refills | Status: DC

## 2020-05-03 NOTE — Patient Instructions (Signed)
For nausea (these may be purchased over-the-counter): -Vitamin B6 (pyridoxine):  25 mg three times each day (may buy 100 mg tablet and take twice per day or try to cut into 4 equal pieces and take 1 piece three times each day).  - doxylamine (found in [employer] and other sleep agents that can be bought in the store): take 12.5 - 25 mg at bedtime.  May take up to 25 mg three time each day.  However, keep in mind that this might make you sleepy.    Obstetrics: Normal and Problem Pregnancies (7th ed., pp. 102-121). [place], [place]: Elsevier."> Textbook of Family Medicine (9th ed., pp. [phone]). [place], [place]: Elsevier [employer]unders.">  First Trimester of Pregnancy  The first trimester of pregnancy starts on the first day of your last menstrual period until the end of week 12. This is months 1 through 3 of pregnancy. A week after a sperm fertilizes an egg, the egg will implant into the wall of the uterus and begin to develop into a baby. By the end of 12 weeks, all the baby's organs will be formed and the baby will be 2-3 inches in size. Body changes during your first trimester Your body goes through many changes during pregnancy. The changes vary and generally return to normal after your baby is born. Physical changes  You may gain or lose weight.  Your breasts may begin to grow larger and become tender. The tissue that surrounds your nipples (areola) may become darker.  Dark spots or blotches (chloasma or mask of pregnancy) may develop on your face.  You may have changes in your hair. These can include thickening or thinning of your hair or changes in texture. Health changes  You may feel nauseous, and you may vomit.  You may have heartburn.  You may develop headaches.  You may develop constipation.  Your gums may bleed and may be sensitive to brushing and flossing. Other changes  You may tire easily.  You may urinate more often.  Your menstrual periods will stop.  You may have a  loss of appetite.  You may develop cravings for certain kinds of food.  You may have changes in your emotions from day to day.  You may have more vivid and strange dreams. Follow these instructions at home: Medicines  Follow your health care provider's instructions regarding medicine use. Specific medicines may be either safe or unsafe to take during pregnancy. Do not take any medicines unless told to by your health care provider.  Take a prenatal vitamin that contains at least 600 micrograms (mcg) of folic acid. Eating and drinking  Eat a healthy diet that includes fresh fruits and vegetables, whole grains, good sources of protein such as meat, eggs, or tofu, and low-fat dairy products.  Avoid raw meat and unpasteurized juice, milk, and cheese. These carry germs that can harm you and your baby.  If you feel nauseous or you vomit: ? Eat 4 or 5 small meals a day instead of 3 large meals. ? Try eating a few soda crackers. ? Drink liquids between meals instead of during meals.  You may need to take these actions to prevent or treat constipation: ? Drink enough fluid to keep your urine pale yellow. ? Eat foods that are high in fiber, such as beans, whole grains, and fresh fruits and vegetables. ? Limit foods that are high in fat and processed sugars, such as fried or sweet foods. Activity  Exercise only as directed by your health  care provider. Most people can continue their usual exercise routine during pregnancy. Try to exercise for 30 minutes at least 5 days a week.  Stop exercising if you develop pain or cramping in the lower abdomen or lower back.  Avoid exercising if it is very hot or humid or if you are at high altitude.  Avoid heavy lifting.  If you choose to, you may have sex unless your health care provider tells you not to. Relieving pain and discomfort  Wear a good support bra to relieve breast tenderness.  Rest with your legs elevated if you have leg cramps or low  back pain.  If you develop bulging veins (varicose veins) in your legs: ? Wear support hose as told by your health care provider. ? Elevate your feet for 15 minutes, 3-4 times a day. ? Limit salt in your diet. Safety  Wear your seat belt at all times when driving or riding in a car.  Talk with your health care provider if someone is verbally or physically abusive to you.  Talk with your health care provider if you are feeling sad or have thoughts of hurting yourself. Lifestyle  Do not use hot tubs, steam rooms, or saunas.  Do not douche. Do not use tampons or scented sanitary pads.  Do not use herbal remedies, alcohol, illegal drugs, or medicines that are not approved by your health care provider. Chemicals in these products can harm your baby.  Do not use any products that contain nicotine or tobacco, such as cigarettes, e-cigarettes, and chewing tobacco. If you need help quitting, ask your health care provider.  Avoid cat litter boxes and soil used by cats. These carry germs that can cause birth defects in the baby and possibly loss of the unborn baby (fetus) by miscarriage or stillbirth. General instructions  During routine prenatal visits in the first trimester, your health care provider will do a physical exam, perform necessary tests, and ask you how things are going. Keep all follow-up visits. This is important.  Ask for help if you have counseling or nutritional needs during pregnancy. Your health care provider can offer advice or refer you to specialists for help with various needs.  Schedule a dentist appointment. At home, brush your teeth with a soft toothbrush. Floss gently.  Write down your questions. Take them to your prenatal visits. Where to find more information  American Pregnancy Association: americanpregnancy.[place] and Gynecologists: PoolDevices.com.pt  Office on [employer]:  KeywordPortfolios.com.br Contact a health care provider if you have:  Dizziness.  A fever.  Mild pelvic cramps, pelvic pressure, or nagging pain in the abdominal area.  Nausea, vomiting, or diarrhea that lasts for 24 hours or longer.  A bad-smelling vaginal discharge.  Pain when you urinate.  Known exposure to a contagious illness, such as chickenpox, measles, Zika virus, HIV, or hepatitis. Get help right away if you have:  Spotting or bleeding from your vagina.  Severe abdominal cramping or pain.  Shortness of breath or chest pain.  Any kind of trauma, such as from a fall or a car crash.  New or increased pain, swelling, or redness in an arm or leg. Summary  The first trimester of pregnancy starts on the first day of your last menstrual period until the end of week 12 (months 1 through 3).  Eating 4 or 5 small meals a day rather than 3 large meals may help to relieve nausea and vomiting.  Do not use any  products that contain nicotine or tobacco, such as cigarettes, e-cigarettes, and chewing tobacco. If you need help quitting, ask your health care provider.  Keep all follow-up visits. This is important. This information is not intended to replace advice given to you by your health care provider. Make sure you discuss any questions you have with your health care provider. Document Revised: 07/13/2019 Document Reviewed: 05/19/2019 Elsevier Patient Education  2021 [employer].

## 2020-05-04 DIAGNOSIS — F32A Depression, unspecified: Secondary | ICD-10-CM | POA: Insufficient documentation

## 2020-05-04 DIAGNOSIS — Z6834 Body mass index (BMI) 34.0-34.9, adult: Secondary | ICD-10-CM | POA: Insufficient documentation

## 2020-05-04 DIAGNOSIS — Z3403 Encounter for supervision of normal first pregnancy, third trimester: Secondary | ICD-10-CM | POA: Insufficient documentation

## 2020-05-04 DIAGNOSIS — Z3401 Encounter for supervision of normal first pregnancy, first trimester: Secondary | ICD-10-CM | POA: Insufficient documentation

## 2020-05-04 DIAGNOSIS — O219 Vomiting of pregnancy, unspecified: Secondary | ICD-10-CM | POA: Insufficient documentation

## 2020-05-04 DIAGNOSIS — F419 Anxiety disorder, unspecified: Secondary | ICD-10-CM | POA: Insufficient documentation

## 2020-05-04 NOTE — Addendum Note (Signed)
Addended byOrlie Pollen on: 05/04/2020 10:48 AM   Modules accepted: Orders

## 2020-05-04 NOTE — Progress Notes (Signed)
New Obstetric Patient H&P    Chief Complaint: missed period, + home UPT    History of Present Illness: Patient is a 30 y.o. G1P0000 Not Hispanic or [place] female, presents with amenorrhea and positive home pregnancy test. Patient's last menstrual period was 02/26/2020. and based on her  LMP, her EDD is Estimated Date of Delivery: 12/02/20 and her EGA is [redacted]w[redacted]d. Cycles are have been irregular. She has recently completed two rounds of clomid with cycle induction. Her LMP was her first spontaneously occuring menstural cycle in several years. Her last pap smear was 01/17/2019 years ago and was NILM.    She had a urine pregnancy test which was positive 5 day(s)  ago. Her last menstrual period was shorter than previous cycles and lasted for 4 day(s). Since her LMP she claims she has experienced N/V, breast tenderness, and headaches. She denies vaginal bleeding. Her past medical history is significant for headaches and allergic rhinitis. This is her first pregnancy.  Since her LMP, she admits to the use of tobacco products  no She reports she has gained 2 pounds since the start of her pregnancy.  There are cats in the home in the home  no  She admits close contact with children on a regular basis  yes  She has had chicken pox in the past yes She has had Tuberculosis exposures, symptoms, or previously tested positive for TB   no Current or past history of domestic violence. no  Genetic Screening/Teratology Counseling: (Includes patient, baby's father, or anyone in either family with:)   26. Patient's age >/= 49 at [facility]  no 2. Thalassemia ([place], [place], [place], or Asian background): MCV<80  no 3. Neural tube defect (meningomyelocele, spina bifida, anencephaly)  no 4. Congenital heart defect  no  5. Down syndrome  no 6. Tay-Sachs (Jewish, [place])  no 7. Canavan's Disease  no 8. Sickle cell disease or trait (African)  no  9. Hemophilia or other blood disorders  no  10. Muscular  dystrophy  no  11. Cystic fibrosis  no  12. Huntington's Chorea  no  13. Mental retardation/autism  no 14. Other inherited genetic or chromosomal disorder  no 15. Maternal metabolic disorder (DM, PKU, etc)  no 16. Patient or FOB with a child with a birth defect not listed above no  16a. Patient or FOB with a birth defect themselves no 17. Recurrent pregnancy loss, or stillbirth  no  18. Any medications since LMP other than prenatal vitamins (include vitamins, supplements, OTC meds, drugs, alcohol)  yes 19. Any other genetic/environmental exposure to discuss  no  Infection History:   1. Lives with someone with TB or TB exposed  no  2. Patient or partner has history of genital herpes  no 3. Rash or viral illness since LMP  no 4. History of STI (GC, CT, HPV, syphilis, HIV)  no 5. 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 She lives with her husband, Merrily Pew, and their two dogs (lab mix and Therapist, occupational mix).     Review of Systems:10 point review of systems negative unless otherwise noted in HPI  Past Medical History:  Patient Active Problem List   Diagnosis Date Noted  . Encounter for supervision of normal first pregnancy in first trimester 05/04/2020     Nursing Staff Provider  Office Location  [facility] Dating   LMP  Language  English Anatomy US    Flu Vaccine  declined Genetic Screen  NIPS: considering  TDaP vaccine    Hgb A1C or  GTT Early : ordered Third trimester :   Rhogam      LAB RESULTS   Feeding Plan  breast Blood Type     Contraception  Antibody    Circumcision  Rubella    Pediatrician   RPR     Support Person  Josh - husband HBsAg     Prenatal Classes  information provided HIV      Varicella    BTL Consent  n/a GBS  (For PCN allergy, check sensitivities)        VBAC Consent  n/a Pap      Hgb Electro   n/a    CF      SMA            . BMI 34.0-34.9,adult 05/04/2020  . Nausea/vomiting in  pregnancy 05/04/2020  . 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 Family planning 07/19/2015  . Cyst of ovary 07/19/2015  . Headache, migraine 12/18/2006    NO AURA.  triggers:  stress, lack of sleep.  Uses Excedrin Migraine or Ibuprofen 800mg  with resolution of headache.   . Irregular bleeding 12/12/2006    Menarche age 45; started on OCPs 2010 for irregular menses.   . Allergic rhinitis 11/14/2005    Past Surgical History:  Past Surgical History:  Procedure Laterality Date  . TONSILLECTOMY  2010  . TYMPANOSTOMY TUBE PLACEMENT      Gynecologic History: Patient's last menstrual period was 02/26/2020.  Obstetric History: G1P0000  Family History:  Family History  Problem Relation Age of Onset  . Asthma Sister   . Migraines Sister   . Allergic rhinitis Sister   . Allergic rhinitis Brother   . Migraines Brother   . Heart Problems Brother        heart valve abnormality  . Mitral valve prolapse Mother   . Migraines Mother   . Other Mother        IBS  . Hypothyroidism Mother   . Depression Father   . Cancer Maternal Grandmother     Social History:  Social History   Socioeconomic History  . Marital status: Married    Spouse name: Not on file  . Number of children: Not on file  . Years of education: Not on file  . Highest education level: Not on file  Occupational History  . Not on file  Tobacco Use  . Smoking status: Never Smoker  . Smokeless tobacco: Never Used  Vaping Use  . Vaping Use: Never used  Substance and Sexual Activity  . Alcohol use: Not Currently    Comment: occasional  . Drug use: No  . Sexual activity: Yes  Other Topics Concern  . Not on file  Social History Narrative  . Not on file   Social Determinants of Health   Financial Resource Strain: Not on file  Food Insecurity: Not on file  Transportation Needs: Not on file  Physical Activity: Not on file  Stress: Not on file  Social Connections: Not on  file  Intimate Partner Violence: Not on file    Allergies:  Allergies  Allergen Reactions  . Hydrocodone-Acetaminophen     tachycardia, anxiety, hallucinations.    Medications: Prior to Admission medications   Medication Sig Start Date End Date Taking? Authorizing Provider  Doxylamine-Pyridoxine (DICLEGIS) 10-10 MG TBEC Take 2 tablets by mouth at bedtime. If symptoms persist, add one tablet in the  morning and one in the afternoon 05/03/20  Yes Nikitta Sobiech, CNM  DULoxetine (CYMBALTA) 20 MG capsule TAKE 1 CAPSULE BY MOUTH EVERY DAY 12/22/19  Yes Jerrol Banana., MD  Prenatal MV & Min w/FA-DHA (PRENATAL ADULT GUMMY/DHA/FA PO) Take by mouth.   Yes [provider]  medroxyPROGESTERone (PROVERA) 10 MG tablet Take 1 tablet (10 mg total) by mouth daily for 10 days. 04/06/20 04/16/20  Will Bonnet, MD    Physical Exam Vitals: Blood pressure 128/82, pulse 98, weight 208 lb (94.3 kg), last menstrual period 02/26/2020.  General: NAD HEENT: normocephalic, anicteric Thyroid: no enlargement, no palpable nodules Pulmonary: No increased work of breathing, CTAB Cardiovascular: RRR, distal pulses 2+ Abdomen: NABS, soft, non-tender, non-distended.  Umbilicus without lesions.  No hepatomegaly, splenomegaly or masses palpable. No evidence of hernia  Genitourinary:  External: Normal external female genitalia.  Normal urethral meatus, normal  Bartholin's and Skene's glands.    Vagina: Normal vaginal mucosa, no evidence of prolapse.    Cervix: Grossly normal in appearance, no bleeding  Bimanual deferred - will confirm dates with [place] Extremities: no edema, erythema, or tenderness Neurologic: Grossly intact Psychiatric: mood appropriate, affect full   Assessment: 30 y.o. G1P0000 at [redacted]w[redacted]d presenting to initiate prenatal care  Plan: 1) Avoid alcoholic beverages. 2) Patient encouraged not to smoke.  3) Discontinue the use of all non-medicinal drugs and chemicals.  4) Take prenatal  vitamins daily.  5) Nutrition, food safety (fish, cheese advisories, and high nitrite foods) and exercise discussed. 6) Hospital and practice style discussed with cross coverage system.  7) Genetic Screening, such as with 1st Trimester Screening, cell free fetal DNA, AFP testing, and Ultrasound, as well as with amniocentesis and CVS as appropriate, is discussed with patient. At the conclusion of today's visit patient undecided genetic testing - considering NIPS, completing Inheritest today  8) NOB labs/GC/CT/urine cx collected  9) N/V in pregnancy: diclegis Rx'd with information provided for OTC alternative. Patient to reach out if N/V does not improve.  10) BMI: 34 - 1h GTT scheduled, reviewed recommendations for weight gain in pregnancy   Orlie Pollen, CNM, MSN [place], [place] 05/04/2020, 8:46 AM

## 2020-05-06 LAB — URINE CULTURE

## 2020-05-07 LAB — CERVICOVAGINAL ANCILLARY ONLY
Chlamydia: NEGATIVE
Comment: NEGATIVE
Comment: NORMAL
Neisseria Gonorrhea: NEGATIVE

## 2020-05-09 ENCOUNTER — Encounter: Payer: Self-pay | Admitting: Obstetrics & Gynecology

## 2020-05-09 ENCOUNTER — Other Ambulatory Visit: Payer: Self-pay

## 2020-05-09 ENCOUNTER — Ambulatory Visit (INDEPENDENT_AMBULATORY_CARE_PROVIDER_SITE_OTHER): Payer: BC Managed Care – PPO | Admitting: Obstetrics & Gynecology

## 2020-05-09 VITALS — BP 126/74 | Wt 205.0 lb

## 2020-05-09 DIAGNOSIS — Z3A01 Less than 8 weeks gestation of pregnancy: Secondary | ICD-10-CM | POA: Diagnosis not present

## 2020-05-09 DIAGNOSIS — Z3401 Encounter for supervision of normal first pregnancy, first trimester: Secondary | ICD-10-CM

## 2020-05-09 DIAGNOSIS — N926 Irregular menstruation, unspecified: Secondary | ICD-10-CM

## 2020-05-09 NOTE — Progress Notes (Signed)
ULTRASOUND REPORT  Location: [facility] Date of Service: 05/09/2020   Indications:dating Findings:  Nelda Marse[place] intrauterine pregnancy is visualized with a CRL consistent with [redacted]w[redacted]d gestation, giving an (U/S) EDD of 12/21/2020. The (U/S) EDD is not consistent with the clinically established EDD of 12/02/2020.  FHR: 150 BPM CRL measurement: 1.38 cm Yolk sac is visualized and appears normal.    Measurement: 0.53 cm Amnion: visualized and appears normal   Right Ovary is normal in appearance. Left Ovary is normal appearance. Corpus luteal cyst:  is not visualized Survey of the adnexa demonstrates no adnexal masses. There is no free peritoneal fluid in the cul de sac.  Impression: 1. [redacted]w[redacted]d Viable Singleton Intrauterine pregnancy by U/S. 2. (U/S) EDD is consistent with Clinically established EDD of 12/21/2020.  Recommendations: 1.Clinical correlation with the patient's History and Physical Exam. 2. Change EDC based on [place] results and h/o prior abnormal period intervals  Hoyt Koch, MD

## 2020-05-09 NOTE — Progress Notes (Signed)
  Subjective  Nausea? Mild No pain or bleeding Reports on Clomid, then stopped for a month, delayed period but this is normal for her, then pregnancy after  Objective  BP 126/74   Wt 205 lb (93 kg)   LMP 02/26/2020   BMI 34.11 kg/m  General: NAD Pumonary: no increased work of breathing Abdomen: gravid, non-tender Extremities: no edema Psychiatric: mood appropriate, affect full  Assessment  30 y.o. G1P0000 at [redacted]w[redacted]d by  12/21/2020, by Ultrasound presenting for routine prenatal visit  Plan   Problem List Items Addressed This Visit      Other   Encounter for supervision of normal first pregnancy in first trimester    Other Visit Diagnoses    Missed period    -  Primary   [redacted] weeks gestation of pregnancy        [place] today (by self), see report Adjust EDC based on results Labs today, NIPT after 10 weeks     Nursing Staff Provider  Office Location  [facility] Dating   [place]  Language  English Anatomy US    Flu Vaccine   declined Genetic Screen  NIPS: considering  TDaP vaccine    Hgb A1C or  GTT Early : ordered Third trimester :   Rhogam      LAB RESULTS   Feeding Plan  breast Blood Type     Contraception  Antibody    Circumcision  Rubella    Pediatrician   RPR     Support Person  Josh - husband HBsAg     Prenatal Classes  information provided HIV      Varicella    BTL Consent  n/a GBS  (For PCN allergy, check sensitivities)        VBAC Consent  n/a Pap      Hgb Electro   n/a    CF      SMA                   Barnett Applebaum, MD, [facility], [facility] 05/09/2020  12:19 PM

## 2020-05-10 LAB — GLUCOSE, 1 HOUR GESTATIONAL: Gestational Diabetes Screen: 107 mg/dL (ref 65–139)

## 2020-05-15 NOTE — Progress Notes (Signed)
This encounter was created in error - please disregard.

## 2020-05-16 ENCOUNTER — Telehealth: Payer: Self-pay

## 2020-05-16 LAB — RPR+RH+ABO+RUB AB+AB SCR+CB...
Antibody Screen: NEGATIVE
HIV Screen 4th Generation wRfx: NONREACTIVE
Hematocrit: 39.2 % (ref 34.0–46.6)
Hemoglobin: 12.8 g/dL (ref 11.1–15.9)
Hepatitis B Surface Ag: NEGATIVE
MCH: 28.3 pg (ref 26.6–33.0)
MCHC: 32.7 g/dL (ref 31.5–35.7)
MCV: 87 fL (ref 79–97)
Platelets: 368 10*3/uL (ref 150–450)
RBC: 4.52 x10E6/uL (ref 3.77–5.28)
RDW: 12.4 % (ref 11.7–15.4)
RPR Ser Ql: NONREACTIVE
Rh Factor: POSITIVE
Rubella Antibodies, IGG: 3.34 index (ref >0.99)
Varicella zoster IgG: 2016 index (ref >165)
WBC: 9.4 10*3/uL (ref 3.4–10.8)

## 2020-05-16 LAB — INHERITEST CORE(CF97,SMA,FRAX)

## 2020-05-16 NOTE — Telephone Encounter (Signed)
Pt calling; [redacted]w[redacted]d; has been taking the nausea med as rx'd for morning sicknessin the pm; starting last night she couldn't keep anything down; today she has had diarrhea every hour.  [phone]  Adv pt to sip clear liquids for 24hrs, then bland food for 24hrs, then work up to reg diet.  Pt aware if she goes for 24hrs and doesn't keep liquids down to be seen.

## 2020-05-30 ENCOUNTER — Other Ambulatory Visit: Payer: Self-pay

## 2020-05-30 ENCOUNTER — Encounter: Payer: Self-pay | Admitting: Obstetrics and Gynecology

## 2020-05-30 ENCOUNTER — Ambulatory Visit (INDEPENDENT_AMBULATORY_CARE_PROVIDER_SITE_OTHER): Payer: BC Managed Care – PPO | Admitting: Obstetrics and Gynecology

## 2020-05-30 VITALS — BP 122/74 | Wt 200.0 lb

## 2020-05-30 DIAGNOSIS — Z1379 Encounter for other screening for genetic and chromosomal anomalies: Secondary | ICD-10-CM

## 2020-05-30 DIAGNOSIS — Z3A10 10 weeks gestation of pregnancy: Secondary | ICD-10-CM

## 2020-05-30 DIAGNOSIS — Z3401 Encounter for supervision of normal first pregnancy, first trimester: Secondary | ICD-10-CM

## 2020-05-30 NOTE — Progress Notes (Signed)
Routine Prenatal Care Visit  Subjective  Ariel Thompson is a 30 y.o. G1P0000 at [redacted]w[redacted]d being seen today for ongoing prenatal care.  She is currently monitored for the following issues for this low-risk pregnancy and has Allergic rhinitis; ADD (attention deficit hyperactivity disorder, inattentive type); Family planning; Irregular bleeding; Headache, migraine; Cyst of ovary; Encounter for supervision of normal first pregnancy in first trimester; BMI 34.0-34.9,adult; Nausea/vomiting in pregnancy; Depression; and Anxiety on their problem list.  ----------------------------------------------------------------------------------- Patient reports no complaints.    . Vag. Bleeding: None.   . Leaking Fluid denies.  ----------------------------------------------------------------------------------- The following portions of the patient's history were reviewed and updated as appropriate: allergies, current medications, past family history, past medical history, past social history, past surgical history and problem list. Problem list updated.  Objective  Blood pressure 122/74, weight 200 lb (90.7 kg), last menstrual period 02/26/2020. Pregravid weight 206 lb (93.4 kg) Total Weight Gain -6 lb (-2.722 kg) Urinalysis: Urine Protein    Urine Glucose    Fetal Status: Fetal Heart Rate (bpm): 170         General:  Alert, oriented and cooperative. Patient is in no acute distress.  Skin: Skin is warm and dry. No rash noted.   Cardiovascular: Normal heart rate noted  Respiratory: Normal respiratory effort, no problems with respiration noted  Abdomen: Soft, gravid, appropriate for gestational age.       Pelvic:  Cervical exam deferred        Extremities: Normal range of motion.     Mental Status: Normal mood and affect. Normal behavior. Normal judgment and thought content.   Assessment   30 y.o. G1P0000 at [redacted]w[redacted]d by  12/21/2020, by Ultrasound presenting for routine prenatal visit  Plan   Pregnancy 1  Problems (from 05/03/20 to present)    Problem Noted Resolved   Encounter for supervision of normal first pregnancy in first trimester 05/04/2020 by Orlie Pollen, CNM No   Overview Addendum 05/30/2020  9:17 AM by Will Bonnet, MD     Nursing Staff Provider  Office Location  [facility] Dating   by 7 wk [place]  Language  English Anatomy US    Flu Vaccine   declined Genetic Screen  NIPS: after 10 wks  TDaP vaccine    Hgb A1C or  GTT Early : 122 Third trimester :   Rhogam      LAB RESULTS   Feeding Plan  breast Blood Type A/Positive/-- (03/17 1518)   Contraception  Antibody Negative (03/17 1518)  Circumcision  Rubella 3.34 (03/17 1518)  Pediatrician   RPR Non Reactive (03/17 1518)   Support Person  Merrily Pew - husband HBsAg Negative (03/17 1518)   Prenatal Classes  information provided HIV Non Reactive (03/17 1518)    Varicella    BTL Consent  n/a GBS  (For PCN allergy, check sensitivities)        VBAC Consent  n/a Pap  2020, due post partum    Hgb Electro   n/a    CF      SMA               Previous Version       Preterm labor symptoms and general obstetric precautions including but not limited to vaginal bleeding, contractions, leaking of fluid and fetal movement were reviewed in detail with the patient. Please refer to After Visit Summary for other counseling recommendations.   - NIPT today  Return in about 4 weeks (around 06/27/2020) for Routine Prenatal Appointment,  then 10 weeks from now for anatomy u/s and routine prenatal.   Prentice Docker, MD, [place], [facility] 05/30/2020 9:32 AM

## 2020-06-05 LAB — MATERNIT 21 PLUS CORE, BLOOD
Fetal Fraction: 7
Result (T21): NEGATIVE
Trisomy 13 (Patau syndrome): NEGATIVE
Trisomy 18 (Edwards syndrome): NEGATIVE
Trisomy 21 (Down syndrome): NEGATIVE

## 2020-06-27 ENCOUNTER — Encounter: Payer: Self-pay | Admitting: Obstetrics and Gynecology

## 2020-06-27 ENCOUNTER — Other Ambulatory Visit: Payer: Self-pay

## 2020-06-27 ENCOUNTER — Ambulatory Visit (INDEPENDENT_AMBULATORY_CARE_PROVIDER_SITE_OTHER): Payer: BC Managed Care – PPO | Admitting: Obstetrics and Gynecology

## 2020-06-27 VITALS — BP 120/70 | Wt 202.0 lb

## 2020-06-27 DIAGNOSIS — O99210 Obesity complicating pregnancy, unspecified trimester: Secondary | ICD-10-CM | POA: Insufficient documentation

## 2020-06-27 DIAGNOSIS — O219 Vomiting of pregnancy, unspecified: Secondary | ICD-10-CM

## 2020-06-27 DIAGNOSIS — Z3A14 14 weeks gestation of pregnancy: Secondary | ICD-10-CM

## 2020-06-27 DIAGNOSIS — O99212 Obesity complicating pregnancy, second trimester: Secondary | ICD-10-CM

## 2020-06-27 DIAGNOSIS — Z3402 Encounter for supervision of normal first pregnancy, second trimester: Secondary | ICD-10-CM

## 2020-06-27 NOTE — Progress Notes (Signed)
Routine Prenatal Care Visit  Subjective  Ariel Thompson is a 30 y.o. G1P0000 at [redacted]w[redacted]d being seen today for ongoing prenatal care.  She is currently monitored for the following issues for this low-risk pregnancy and has Allergic rhinitis; ADD (attention deficit hyperactivity disorder, inattentive type); Family planning; Irregular bleeding; Headache, migraine; Cyst of ovary; Encounter for supervision of normal first pregnancy in first trimester; BMI 34.0-34.9,adult; Nausea/vomiting in pregnancy; Depression; Anxiety; and Obesity affecting pregnancy on their problem list.  ----------------------------------------------------------------------------------- Patient reports no complaints.    . Vag. Bleeding: None.  Movement: Absent. Leaking Fluid denies.  ----------------------------------------------------------------------------------- The following portions of the patient's history were reviewed and updated as appropriate: allergies, current medications, past family history, past medical history, past social history, past surgical history and problem list. Problem list updated.  Objective  Blood pressure 120/70, weight 202 lb (91.6 kg), last menstrual period 02/26/2020. Pregravid weight 206 lb (93.4 kg) Total Weight Gain -4 lb (-1.814 kg) Urinalysis: Urine Protein    Urine Glucose    Fetal Status: Fetal Heart Rate (bpm): 150   Movement: Absent     General:  Alert, oriented and cooperative. Patient is in no acute distress.  Skin: Skin is warm and dry. No rash noted.   Cardiovascular: Normal heart rate noted  Respiratory: Normal respiratory effort, no problems with respiration noted  Abdomen: Soft, gravid, appropriate for gestational age.       Pelvic:  Cervical exam deferred        Extremities: Normal range of motion.     Mental Status: Normal mood and affect. Normal behavior. Normal judgment and thought content.   Assessment   30 y.o. G1P0000 at [redacted]w[redacted]d by  12/21/2020, by Ultrasound  presenting for routine prenatal visit  Plan   Pregnancy 1 Problems (from 05/03/20 to present)    Problem Noted Resolved   Obesity affecting pregnancy 06/27/2020 by Will Bonnet, MD No   Encounter for supervision of normal first pregnancy in first trimester 05/04/2020 by Orlie Pollen, CNM No   Overview Addendum 05/30/2020  9:17 AM by Will Bonnet, MD     Nursing Staff Provider  Office Location  [facility] Dating   by 7 wk [place]  Language  English Anatomy US    Flu Vaccine   declined Genetic Screen  NIPS: after 10 wks  TDaP vaccine    Hgb A1C or  GTT Early : 122 Third trimester :   Rhogam      LAB RESULTS   Feeding Plan  breast Blood Type A/Positive/-- (03/17 1518)   Contraception  Antibody Negative (03/17 1518)  Circumcision  Rubella 3.34 (03/17 1518)  Pediatrician   RPR Non Reactive (03/17 1518)   Support Person  Merrily Pew - husband HBsAg Negative (03/17 1518)   Prenatal Classes  information provided HIV Non Reactive (03/17 1518)    Varicella    BTL Consent  n/a GBS  (For PCN allergy, check sensitivities)        VBAC Consent  n/a Pap  2020, due post partum    Hgb Electro   n/a    CF      SMA               Previous Version       Preterm labor symptoms and general obstetric precautions including but not limited to vaginal bleeding, contractions, leaking of fluid and fetal movement were reviewed in detail with the patient. Please refer to After Visit Summary for other counseling recommendations.  Return in about 4 weeks (around 07/25/2020) for Routine Prenatal Appointment.   Prentice Docker, MD, [facility], [facility] 06/27/2020 4:59 PM

## 2020-07-24 ENCOUNTER — Other Ambulatory Visit: Payer: Self-pay

## 2020-07-24 ENCOUNTER — Ambulatory Visit
Admission: RE | Admit: 2020-07-24 | Discharge: 2020-07-24 | Disposition: A | Discharge status: Home / Self Care | Payer: BC Managed Care – PPO | Source: Ambulatory Visit | Attending: Obstetrics and Gynecology | Admitting: Obstetrics and Gynecology

## 2020-07-24 DIAGNOSIS — Z3401 Encounter for supervision of normal first pregnancy, first trimester: Secondary | ICD-10-CM | POA: Insufficient documentation

## 2020-07-26 ENCOUNTER — Other Ambulatory Visit: Payer: Self-pay

## 2020-07-26 DIAGNOSIS — Z0283 Encounter for blood-alcohol and blood-drug test: Secondary | ICD-10-CM

## 2020-07-26 NOTE — Progress Notes (Signed)
Presents to [facility] for on-site pre-employment drug screen.  [facility] Acct #:  [id number] [facility] Specimen #:  [id number]  Rapid Drug Screen Results = Negative  AMD

## 2020-08-09 ENCOUNTER — Ambulatory Visit: Admission: RE | Admit: 2020-08-09 | Payer: Self-pay | Source: Ambulatory Visit

## 2020-08-10 ENCOUNTER — Ambulatory Visit (INDEPENDENT_AMBULATORY_CARE_PROVIDER_SITE_OTHER): Payer: BC Managed Care – PPO | Admitting: Obstetrics and Gynecology

## 2020-08-10 ENCOUNTER — Encounter: Payer: Self-pay | Admitting: Obstetrics and Gynecology

## 2020-08-10 ENCOUNTER — Other Ambulatory Visit: Payer: Self-pay

## 2020-08-10 VITALS — BP 110/70 | Wt 212.0 lb

## 2020-08-10 DIAGNOSIS — Z3A21 21 weeks gestation of pregnancy: Secondary | ICD-10-CM

## 2020-08-10 DIAGNOSIS — O219 Vomiting of pregnancy, unspecified: Secondary | ICD-10-CM

## 2020-08-10 DIAGNOSIS — Z3402 Encounter for supervision of normal first pregnancy, second trimester: Secondary | ICD-10-CM

## 2020-08-10 DIAGNOSIS — O99212 Obesity complicating pregnancy, second trimester: Secondary | ICD-10-CM

## 2020-08-10 LAB — POCT URINALYSIS DIPSTICK OB
Glucose, UA: NEGATIVE
POC,PROTEIN,UA: NEGATIVE

## 2020-08-10 NOTE — Addendum Note (Signed)
Addended by: Drenda Freeze on: 08/10/2020 04:12 PM   Modules accepted: Orders

## 2020-08-10 NOTE — Progress Notes (Signed)
Routine Prenatal Care Visit  Subjective  Ariel Thompson is a 30 y.o. G1P0000 at [redacted]w[redacted]d being seen today for ongoing prenatal care.  She is currently monitored for the following issues for this low-risk pregnancy and has Allergic rhinitis; ADD (attention deficit hyperactivity disorder, inattentive type); Family planning; Irregular bleeding; Headache, migraine; Cyst of ovary; Encounter for supervision of normal first pregnancy in first trimester; BMI 34.0-34.9,adult; Nausea/vomiting in pregnancy; Depression; Anxiety; and Obesity affecting pregnancy on their problem list.  ----------------------------------------------------------------------------------- Patient reports no complaints.    . Vag. Bleeding: None.  Movement: Absent. Leaking Fluid denies.  Anatomy u/s incomplete. Needs RVOT views. Otherwise normal.  ----------------------------------------------------------------------------------- The following portions of the patient's history were reviewed and updated as appropriate: allergies, current medications, past family history, past medical history, past social history, past surgical history and problem list. Problem list updated.  Objective  Blood pressure 110/70, weight 212 lb (96.2 kg), last menstrual period 02/26/2020. Pregravid weight 206 lb (93.4 kg) Total Weight Gain 6 lb (2.722 kg) Urinalysis: Urine Protein    Urine Glucose    Fetal Status: Fetal Heart Rate (bpm): 145   Movement: Absent     General:  Alert, oriented and cooperative. Patient is in no acute distress.  Skin: Skin is warm and dry. No rash noted.   Cardiovascular: Normal heart rate noted  Respiratory: Normal respiratory effort, no problems with respiration noted  Abdomen: Soft, gravid, appropriate for gestational age. Pain/Pressure: Absent     Pelvic:  Cervical exam deferred        Extremities: Normal range of motion.     Mental Status: Normal mood and affect. Normal behavior. Normal judgment and thought content.    Assessment   30 y.o. G1P0000 at [redacted]w[redacted]d by  12/21/2020, by Ultrasound presenting for routine prenatal visit  Plan   Pregnancy 1 Problems (from 05/03/20 to present)     Problem Noted Resolved   Obesity affecting pregnancy 06/27/2020 by Will Bonnet, MD No   Encounter for supervision of normal first pregnancy in first trimester 05/04/2020 by Orlie Pollen, CNM No   Overview Addendum 05/30/2020  9:17 AM by Will Bonnet, MD     Nursing Staff Provider  Office Location  [facility] Dating   by 7 wk [place]  Language  English Anatomy US    Flu Vaccine   declined Genetic Screen  NIPS: after 10 wks  TDaP vaccine    Hgb A1C or  GTT Early : 122 Third trimester :   Rhogam      LAB RESULTS   Feeding Plan  breast Blood Type A/Positive/-- (03/17 1518)   Contraception  Antibody Negative (03/17 1518)  Circumcision  Rubella 3.34 (03/17 1518)  Pediatrician   RPR Non Reactive (03/17 1518)   Support Person  Merrily Pew - husband HBsAg Negative (03/17 1518)   Prenatal Classes  information provided HIV Non Reactive (03/17 1518)    Varicella    BTL Consent  n/a GBS  (For PCN allergy, check sensitivities)        VBAC Consent  n/a Pap  2020, due post partum    Hgb Electro   n/a    CF      SMA                    Preterm labor symptoms and general obstetric precautions including but not limited to vaginal bleeding, contractions, leaking of fluid and fetal movement were reviewed in detail with the patient. Please refer to After Visit  Summary for other counseling recommendations.   - follow up ultrasound for incomplete anatomy (RVOT) views  Return in about 4 weeks (around 09/07/2020) for Routine Prenatal Appointment.   Prentice Docker, MD, [facility], [facility] 08/10/2020 4:08 PM

## 2020-09-04 ENCOUNTER — Ambulatory Visit (INDEPENDENT_AMBULATORY_CARE_PROVIDER_SITE_OTHER): Payer: BC Managed Care – PPO | Admitting: Obstetrics and Gynecology

## 2020-09-04 ENCOUNTER — Encounter: Payer: Self-pay | Admitting: Obstetrics and Gynecology

## 2020-09-04 ENCOUNTER — Other Ambulatory Visit: Payer: Self-pay

## 2020-09-04 VITALS — BP 138/80 | Wt 215.0 lb

## 2020-09-04 DIAGNOSIS — Z3A24 24 weeks gestation of pregnancy: Secondary | ICD-10-CM

## 2020-09-04 DIAGNOSIS — O99212 Obesity complicating pregnancy, second trimester: Secondary | ICD-10-CM

## 2020-09-04 DIAGNOSIS — O219 Vomiting of pregnancy, unspecified: Secondary | ICD-10-CM

## 2020-09-04 DIAGNOSIS — Z3402 Encounter for supervision of normal first pregnancy, second trimester: Secondary | ICD-10-CM

## 2020-09-04 DIAGNOSIS — Z113 Encounter for screening for infections with a predominantly sexual mode of transmission: Secondary | ICD-10-CM

## 2020-09-04 DIAGNOSIS — Z131 Encounter for screening for diabetes mellitus: Secondary | ICD-10-CM

## 2020-09-04 LAB — POCT URINALYSIS DIPSTICK OB
Glucose, UA: NEGATIVE
POC,PROTEIN,UA: NEGATIVE

## 2020-09-04 NOTE — Progress Notes (Signed)
Routine Prenatal Care Visit  Subjective  Ariel Thompson is a 30 y.o. G1P0000 at [redacted]w[redacted]d being seen today for ongoing prenatal care.  She is currently monitored for the following issues for this low-risk pregnancy and has Allergic rhinitis; ADD (attention deficit hyperactivity disorder, inattentive type); Family planning; Irregular bleeding; Headache, migraine; Cyst of ovary; Encounter for supervision of normal first pregnancy in first trimester; BMI 34.0-34.9,adult; Nausea/vomiting in pregnancy; Depression; Anxiety; and Obesity affecting pregnancy on their problem list.  ----------------------------------------------------------------------------------- Patient reports no complaints.    . Vag. Bleeding: None.  Movement: Present. Leaking Fluid denies.  ----------------------------------------------------------------------------------- The following portions of the patient's history were reviewed and updated as appropriate: allergies, current medications, past family history, past medical history, past social history, past surgical history and problem list. Problem list updated.  Objective  Blood pressure 138/80, weight 215 lb (97.5 kg), last menstrual period 02/26/2020. Pregravid weight 206 lb (93.4 kg) Total Weight Gain 9 lb (4.082 kg) Urinalysis: Urine Protein Negative  Urine Glucose Negative  Fetal Status: Fetal Heart Rate (bpm): 145 Fundal Height: 25 cm Movement: Present     General:  Alert, oriented and cooperative. Patient is in no acute distress.  Skin: Skin is warm and dry. No rash noted.   Cardiovascular: Normal heart rate noted  Respiratory: Normal respiratory effort, no problems with respiration noted  Abdomen: Soft, gravid, appropriate for gestational age. Pain/Pressure: Absent     Pelvic:  Cervical exam deferred        Extremities: Normal range of motion.     Mental Status: Normal mood and affect. Normal behavior. Normal judgment and thought content.   Assessment   30 y.o.  G1P0000 at [redacted]w[redacted]d by  12/21/2020, by Ultrasound presenting for routine prenatal visit  Plan   Pregnancy 1 Problems (from 05/03/20 to present)     Problem Noted Resolved   Obesity affecting pregnancy 06/27/2020 by Will Bonnet, MD No   Encounter for supervision of normal first pregnancy in first trimester 05/04/2020 by Orlie Pollen, CNM No   Overview Addendum 05/30/2020  9:17 AM by Will Bonnet, MD     Nursing Staff Provider  Office Location  [facility] Dating   by 7 wk [place]  Language  English Anatomy US    Flu Vaccine   declined Genetic Screen  NIPS: after 10 wks  TDaP vaccine    Hgb A1C or  GTT Early : 122 Third trimester :   Rhogam      LAB RESULTS   Feeding Plan  breast Blood Type A/Positive/-- (03/17 1518)   Contraception  Antibody Negative (03/17 1518)  Circumcision  Rubella 3.34 (03/17 1518)  Pediatrician   RPR Non Reactive (03/17 1518)   Support Person  Merrily Pew - husband HBsAg Negative (03/17 1518)   Prenatal Classes  information provided HIV Non Reactive (03/17 1518)    Varicella    BTL Consent  n/a GBS  (For PCN allergy, check sensitivities)        VBAC Consent  n/a Pap  2020, due post partum    Hgb Electro   n/a    CF      SMA                   Preterm labor symptoms and general obstetric precautions including but not limited to vaginal bleeding, contractions, leaking of fluid and fetal movement were reviewed in detail with the patient. Please refer to After Visit Summary for other counseling recommendations.   Return in about  3 weeks (around 09/25/2020) for Add 28 week labs to appt with SDJ on 8/9.   Prentice Docker, MD, [facility], [facility] 09/04/2020 4:44 PM

## 2020-09-21 ENCOUNTER — Ambulatory Visit
Admission: RE | Admit: 2020-09-21 | Discharge: 2020-09-21 | Disposition: A | Discharge status: Home / Self Care | Payer: BC Managed Care – PPO | Source: Ambulatory Visit | Attending: Obstetrics and Gynecology | Admitting: Obstetrics and Gynecology

## 2020-09-21 DIAGNOSIS — Z3402 Encounter for supervision of normal first pregnancy, second trimester: Secondary | ICD-10-CM

## 2020-09-25 ENCOUNTER — Ambulatory Visit (INDEPENDENT_AMBULATORY_CARE_PROVIDER_SITE_OTHER): Payer: BC Managed Care – PPO | Admitting: Obstetrics and Gynecology

## 2020-09-25 ENCOUNTER — Other Ambulatory Visit: Payer: Self-pay

## 2020-09-25 ENCOUNTER — Encounter: Payer: Self-pay | Admitting: Obstetrics and Gynecology

## 2020-09-25 VITALS — BP 122/80 | Wt 214.0 lb

## 2020-09-25 DIAGNOSIS — Z3403 Encounter for supervision of normal first pregnancy, third trimester: Secondary | ICD-10-CM

## 2020-09-25 DIAGNOSIS — O99213 Obesity complicating pregnancy, third trimester: Secondary | ICD-10-CM

## 2020-09-25 DIAGNOSIS — Z3A27 27 weeks gestation of pregnancy: Secondary | ICD-10-CM

## 2020-09-25 NOTE — Progress Notes (Signed)
Routine Prenatal Care Visit  Subjective  Ariel Thompson is a 30 y.o. G1P0000 at 4w4dbeing seen today for ongoing prenatal care.  She is currently monitored for the following issues for this low-risk pregnancy and has Allergic rhinitis; ADD (attention deficit hyperactivity disorder, inattentive type); Family planning; Irregular bleeding; Headache, migraine; Cyst of ovary; Encounter for supervision of normal first pregnancy in first trimester; BMI 34.0-34.9,adult; Nausea/vomiting in pregnancy; Depression; Anxiety; and Obesity affecting pregnancy on their problem list.  ----------------------------------------------------------------------------------- Patient reports no complaints.    . Vag. Bleeding: None.  Movement: Present. Leaking Fluid denies.  ----------------------------------------------------------------------------------- The following portions of the patient's history were reviewed and updated as appropriate: allergies, current medications, past family history, past medical history, past social history, past surgical history and problem list. Problem list updated.  Objective  Blood pressure 122/80, weight 214 lb (97.1 kg), last menstrual period 02/26/2020. Pregravid weight 206 lb (93.4 kg) Total Weight Gain 8 lb (3.629 kg) Urinalysis: Urine Protein    Urine Glucose    Fetal Status: Fetal Heart Rate (bpm): 145 Fundal Height: 28 cm Movement: Present     General:  Alert, oriented and cooperative. Patient is in no acute distress.  Skin: Skin is warm and dry. No rash noted.   Cardiovascular: Normal heart rate noted  Respiratory: Normal respiratory effort, no problems with respiration noted  Abdomen: Soft, gravid, appropriate for gestational age. Pain/Pressure: Present     Pelvic:  Cervical exam deferred        Extremities: Normal range of motion.     Mental Status: Normal mood and affect. Normal behavior. Normal judgment and thought content.   Assessment   30y.o. G1P0000 at  261w4dy  12/21/2020, by Ultrasound presenting for routine prenatal visit  Plan   Pregnancy 1 Problems (from 05/03/20 to present)     Problem Noted Resolved   Obesity affecting pregnancy 06/27/2020 by JaWill BonnetMD No   Encounter for supervision of normal first pregnancy in first trimester 05/04/2020 by VeOrlie PollenCNM No   Overview Addendum 09/25/2020 11:46 AM by JaWill BonnetMD     Nursing Staff Provider  Office Location  [facility] Dating   by 7 wk [place]  English Anatomy [place] early 09/2020  Flu Vaccine   declined Genetic Screen  NIPS: after 10 wks  TDaP vaccine    Hgb A1C or  GTT Early : 122 Third trimester :   Rhogam      LAB RESULTS   Feeding Plan  breast Blood Type A/Positive/-- (03/17 1518)   Contraception  Antibody Negative (03/17 1518)  Circumcision  Rubella 3.34 (03/17 1518)  Pediatrician   RPR Non Reactive (03/17 1518)   Support Person  JoMerrily Pew husband HBsAg Negative (03/17 1518)   Prenatal Classes  information provided HIV Non Reactive (03/17 1518)    Varicella    BTL Consent  n/a GBS  (For PCN allergy, check sensitivities)        VBAC Consent  n/a Pap  2020, due post partum    Hgb Electro   n/a    CF      SMA                   Preterm labor symptoms and general obstetric precautions including but not limited to vaginal bleeding, contractions, leaking of fluid and fetal movement were reviewed in detail with the patient. Please refer to After Visit Summary for other counseling recommendations.  Anatomy scan complete on 8/5.   28 wk labs next visit since we had no phlebotomist today  Return in about 2 weeks (around 10/09/2020) for 28 week labs with ROB.   Prentice Docker, MD, [facility], [facility] 09/25/2020 11:53 AM

## 2020-10-09 ENCOUNTER — Other Ambulatory Visit: Payer: BC Managed Care – PPO

## 2020-10-09 ENCOUNTER — Other Ambulatory Visit: Payer: Self-pay

## 2020-10-09 DIAGNOSIS — Z3402 Encounter for supervision of normal first pregnancy, second trimester: Secondary | ICD-10-CM

## 2020-10-09 DIAGNOSIS — O99212 Obesity complicating pregnancy, second trimester: Secondary | ICD-10-CM

## 2020-10-09 DIAGNOSIS — Z113 Encounter for screening for infections with a predominantly sexual mode of transmission: Secondary | ICD-10-CM

## 2020-10-09 DIAGNOSIS — Z131 Encounter for screening for diabetes mellitus: Secondary | ICD-10-CM

## 2020-10-09 LAB — OB RESULTS CONSOLE HIV ANTIBODY (ROUTINE TESTING): HIV: NONREACTIVE

## 2020-10-10 LAB — 28 WEEK RH+PANEL
Basophils Absolute: 0 10*3/uL (ref 0.0–0.2)
Basos: 0 %
EOS (ABSOLUTE): 0.1 10*3/uL (ref 0.0–0.4)
Eos: 1 %
Gestational Diabetes Screen: 126 mg/dL (ref 65–139)
HIV Screen 4th Generation wRfx: NONREACTIVE
Hematocrit: 30.5 % — ABNORMAL LOW (ref 34.0–46.6)
Hemoglobin: 10.4 g/dL — ABNORMAL LOW (ref 11.1–15.9)
Immature Grans (Abs): 0 10*3/uL (ref 0.0–0.1)
Immature Granulocytes: 0 %
Lymphocytes Absolute: 1.9 10*3/uL (ref 0.7–3.1)
Lymphs: 19 %
MCH: 29.3 pg (ref 26.6–33.0)
MCHC: 34.1 g/dL (ref 31.5–35.7)
MCV: 86 fL (ref 79–97)
Monocytes Absolute: 0.5 10*3/uL (ref 0.1–0.9)
Monocytes: 5 %
Neutrophils Absolute: 7.6 10*3/uL — ABNORMAL HIGH (ref 1.4–7.0)
Neutrophils: 75 %
Platelets: 339 10*3/uL (ref 150–450)
RBC: 3.55 x10E6/uL — ABNORMAL LOW (ref 3.77–5.28)
RDW: 12 % (ref 11.7–15.4)
RPR Ser Ql: NONREACTIVE
WBC: 10.1 10*3/uL (ref 3.4–10.8)

## 2020-10-19 ENCOUNTER — Other Ambulatory Visit: Payer: Self-pay

## 2020-10-19 ENCOUNTER — Ambulatory Visit (INDEPENDENT_AMBULATORY_CARE_PROVIDER_SITE_OTHER): Payer: BC Managed Care – PPO | Admitting: Obstetrics and Gynecology

## 2020-10-19 ENCOUNTER — Encounter: Payer: Self-pay | Admitting: Obstetrics and Gynecology

## 2020-10-19 VITALS — BP 120/72 | Ht 63.0 in | Wt 220.8 lb

## 2020-10-19 DIAGNOSIS — O26843 Uterine size-date discrepancy, third trimester: Secondary | ICD-10-CM

## 2020-10-19 DIAGNOSIS — Z23 Encounter for immunization: Secondary | ICD-10-CM | POA: Diagnosis not present

## 2020-10-19 DIAGNOSIS — Z3403 Encounter for supervision of normal first pregnancy, third trimester: Secondary | ICD-10-CM

## 2020-10-19 DIAGNOSIS — Z3A31 31 weeks gestation of pregnancy: Secondary | ICD-10-CM

## 2020-10-19 NOTE — Progress Notes (Signed)
p 

## 2020-10-19 NOTE — Progress Notes (Signed)
Routine Prenatal Care Visit  Subjective  Ariel Thompson is a 30 y.o. G1P0000 at 7w0dbeing seen today for ongoing prenatal care.  She is currently monitored for the following issues for this low-risk pregnancy and has Allergic rhinitis; ADD (attention deficit hyperactivity disorder, inattentive type); Family planning; Irregular bleeding; Headache, migraine; Cyst of ovary; Encounter for supervision of normal first pregnancy in third trimester; BMI 34.0-34.9,adult; Nausea/vomiting in pregnancy; Depression; Anxiety; and Obesity affecting pregnancy on their problem list.  ----------------------------------------------------------------------------------- Patient reports backache.   Contractions: Not present. Vag. Bleeding: None.  Movement: Present. Denies leaking of fluid.  ----------------------------------------------------------------------------------- The following portions of the patient's history were reviewed and updated as appropriate: allergies, current medications, past family history, past medical history, past social history, past surgical history and problem list. Problem list updated.   Objective  Blood pressure 120/72, height '5\' 3"'$  (1.6 m), weight 220 lb 12.8 oz (100.2 kg), last menstrual period 02/26/2020. Pregravid weight 206 lb (93.4 kg) Total Weight Gain 14 lb 12.8 oz (6.713 kg) Urinalysis:      Fetal Status: Fetal Heart Rate (bpm): 140 Fundal Height: 36 cm Movement: Present     General:  Alert, oriented and cooperative. Patient is in no acute distress.  Skin: Skin is warm and dry. No rash noted.   Cardiovascular: Normal heart rate noted  Respiratory: Normal respiratory effort, no problems with respiration noted  Abdomen: Soft, gravid, appropriate for gestational age. Pain/Pressure: Absent     Pelvic:  Cervical exam deferred        Extremities: Normal range of motion.  Edema: None  Mental Status: Normal mood and affect. Normal behavior. Normal judgment and thought  content.     Assessment   29y.o. G1P0000 at 367w0dy  12/21/2020, by Ultrasound presenting for routine prenatal visit  Plan   Pregnancy 1 Problems (from 05/03/20 to present)     Problem Noted Resolved   Obesity affecting pregnancy 06/27/2020 by JaWill BonnetMD No   Encounter for supervision of normal first pregnancy in third trimester 05/04/2020 by VeOrlie PollenCNM No   Overview Addendum 10/19/2020  4:44 PM by ScHomero FellersMD     Nursing Staff Provider  Office Location  [facility] Dating   by 7 wk [place]Language  English Anatomy [place] early 09/2020  Flu Vaccine   declined Genetic Screen  NIPS: after 10 wks  TDaP vaccine   10/19/2020 Hgb A1C or  GTT Early : 122 Third trimester : 126  Rhogam   not needed   LAB RESULTS   Feeding Plan  breast Blood Type A/Positive/-- (03/17 1518)   Contraception  Antibody Negative (03/17 1518)  Circumcision  Rubella 3.34 (03/17 1518)  Pediatrician   RPR Non Reactive (03/17 1518)   Support Person  JoMerrily Pew husband HBsAg Negative (03/17 1518)   Prenatal Classes  information provided HIV Non Reactive (03/17 1518)    Varicella  immune  BTL Consent  n/a GBS  (For PCN allergy, check sensitivities)        VBAC Consent  n/a Pap  2020, due postpartum    Hgb Electro   n/a    CF      SMA                   Growth ultrasound ordered for same uterine size date discrepancy. Tdap vaccination today  Gestational age appropriate obstetric precautions including but not limited to vaginal bleeding, contractions, leaking of fluid  and fetal movement were reviewed in detail with the patient.    Return in about 2 weeks (around 11/02/2020) for ROB in person.   Homero Fellers MD [facility], [facility] 10/19/2020, 4:57 PM

## 2020-10-19 NOTE — Patient Instructions (Signed)

## 2020-11-05 ENCOUNTER — Other Ambulatory Visit: Payer: Self-pay

## 2020-11-05 ENCOUNTER — Ambulatory Visit
Admission: RE | Admit: 2020-11-05 | Discharge: 2020-11-05 | Disposition: A | Discharge status: Home / Self Care | Payer: BC Managed Care – PPO | Source: Ambulatory Visit | Attending: Obstetrics and Gynecology | Admitting: Obstetrics and Gynecology

## 2020-11-05 DIAGNOSIS — O26843 Uterine size-date discrepancy, third trimester: Secondary | ICD-10-CM | POA: Insufficient documentation

## 2020-11-06 ENCOUNTER — Encounter: Payer: Self-pay | Admitting: Obstetrics and Gynecology

## 2020-11-06 ENCOUNTER — Ambulatory Visit (INDEPENDENT_AMBULATORY_CARE_PROVIDER_SITE_OTHER): Payer: BC Managed Care – PPO | Admitting: Obstetrics and Gynecology

## 2020-11-06 VITALS — BP 126/84 | Wt 222.0 lb

## 2020-11-06 DIAGNOSIS — Z3A33 33 weeks gestation of pregnancy: Secondary | ICD-10-CM

## 2020-11-06 DIAGNOSIS — O99213 Obesity complicating pregnancy, third trimester: Secondary | ICD-10-CM

## 2020-11-06 DIAGNOSIS — O26813 Pregnancy related exhaustion and fatigue, third trimester: Secondary | ICD-10-CM

## 2020-11-06 DIAGNOSIS — Z3403 Encounter for supervision of normal first pregnancy, third trimester: Secondary | ICD-10-CM

## 2020-11-06 NOTE — Progress Notes (Signed)
Routine Prenatal Care Visit  Subjective  JOELEE Thompson is a 30 y.o. G1P0000 at [redacted]w[redacted]d being seen today for ongoing prenatal care.  She is currently monitored for the following issues for this high-risk pregnancy and has Allergic rhinitis; ADD (attention deficit hyperactivity disorder, inattentive type); Family planning; Irregular bleeding; Headache, migraine; Cyst of ovary; Encounter for supervision of normal first pregnancy in third trimester; BMI 34.0-34.9,adult; Nausea/vomiting in pregnancy; Depression; Anxiety; and Obesity affecting pregnancy on their problem list.  ----------------------------------------------------------------------------------- Patient reports no complaints.   Contractions: Not present. Vag. Bleeding: None.  Movement: Present. Leaking Fluid denies.  ----------------------------------------------------------------------------------- The following portions of the patient's history were reviewed and updated as appropriate: allergies, current medications, past family history, past medical history, past social history, past surgical history and problem list. Problem list updated.  Objective  Blood pressure 126/84, weight 222 lb (100.7 kg), last menstrual period 02/26/2020. Pregravid weight 206 lb (93.4 kg) Total Weight Gain 16 lb (7.258 kg) Urinalysis: Urine Protein    Urine Glucose    Fetal Status: Fetal Heart Rate (bpm): 150   Movement: Present  Presentation: Vertex  General:  Alert, oriented and cooperative. Patient is in no acute distress.  Skin: Skin is warm and dry. No rash noted.   Cardiovascular: Normal heart rate noted  Respiratory: Normal respiratory effort, no problems with respiration noted  Abdomen: Soft, gravid, appropriate for gestational age. Pain/Pressure: Absent     Pelvic:  Cervical exam deferred        Extremities: Normal range of motion.     Mental Status: Normal mood and affect. Normal behavior. Normal judgment and thought content.   Assessment    30 y.o. G1P0000 at [redacted]w[redacted]d by  12/21/2020, by Ultrasound presenting for routine prenatal visit  Plan   Pregnancy 1 Problems (from 05/03/20 to present)     Problem Noted Resolved   Obesity affecting pregnancy 06/27/2020 by Ariel Bonnet, MD No   Encounter for supervision of normal first pregnancy in third trimester 05/04/2020 by Ariel Thompson, CNM No   Overview Addendum 10/19/2020  4:44 PM by Ariel Fellers, MD     Nursing Staff Provider  Office Location  [facility] Dating   by 7 wk [place]  Language  English Anatomy US  Complete early 09/2020  Flu Vaccine   declined Genetic Screen  NIPS: after 10 wks  TDaP vaccine   10/19/2020 Hgb A1C or  GTT Early : 122 Third trimester : 126  Rhogam   not needed   LAB RESULTS   Feeding Plan  breast Blood Type A/Positive/-- (03/17 1518)   Contraception  Antibody Negative (03/17 1518)  Circumcision  Rubella 3.34 (03/17 1518)  Pediatrician   RPR Non Reactive (03/17 1518)   Support Person  Ariel Thompson - husband HBsAg Negative (03/17 1518)   Prenatal Classes  information provided HIV Non Reactive (03/17 1518)    Varicella  immune  BTL Consent  n/a GBS  (For PCN allergy, check sensitivities)        VBAC Consent  n/a Pap  2020, due postpartum    Hgb Electro   n/a    CF      SMA                   Preterm labor symptoms and general obstetric precautions including but not limited to vaginal bleeding, contractions, leaking of fluid and fetal movement were reviewed in detail with the patient. Please refer to After Visit Summary for other counseling recommendations.   -  Growth u/s yesterday normal with normal AFI - flu vaccine last week - cbc/iron studies for fatigue  Return in about 2 weeks (around 11/20/2020) for ROB (labs any day).   Ariel Docker, MD, [facility], [facility] 11/06/2020 4:54 PM

## 2020-11-20 ENCOUNTER — Encounter: Payer: Self-pay | Admitting: Advanced Practice Midwife

## 2020-11-20 ENCOUNTER — Ambulatory Visit (INDEPENDENT_AMBULATORY_CARE_PROVIDER_SITE_OTHER): Payer: BC Managed Care – PPO | Admitting: Advanced Practice Midwife

## 2020-11-20 ENCOUNTER — Other Ambulatory Visit: Payer: Self-pay

## 2020-11-20 VITALS — BP 126/82 | Wt 225.0 lb

## 2020-11-20 DIAGNOSIS — Z3A35 35 weeks gestation of pregnancy: Secondary | ICD-10-CM

## 2020-11-20 DIAGNOSIS — Z3403 Encounter for supervision of normal first pregnancy, third trimester: Secondary | ICD-10-CM

## 2020-11-20 LAB — POCT URINALYSIS DIPSTICK OB
Glucose, UA: NEGATIVE
POC,PROTEIN,UA: NEGATIVE

## 2020-11-20 NOTE — Progress Notes (Signed)
ROB - no concerns. RM 5

## 2020-11-20 NOTE — Progress Notes (Signed)
Routine Prenatal Care Visit  Subjective  Ariel Thompson is a 30 y.o. G1P0000 at [redacted]w[redacted]d being seen today for ongoing prenatal care.  She is currently monitored for the following issues for this low-risk pregnancy and has Allergic rhinitis; ADD (attention deficit hyperactivity disorder, inattentive type); Family planning; Irregular bleeding; Headache, migraine; Cyst of ovary; Encounter for supervision of normal first pregnancy in third trimester; BMI 34.0-34.9,adult; Nausea/vomiting in pregnancy; Depression; Anxiety; and Obesity affecting pregnancy on their problem list.  ----------------------------------------------------------------------------------- Patient reports some ongoing pubic symphysis pain.   Contractions: Not present. Vag. Bleeding: None.  Movement: Present. Leaking Fluid denies.  ----------------------------------------------------------------------------------- The following portions of the patient's history were reviewed and updated as appropriate: allergies, current medications, past family history, past medical history, past social history, past surgical history and problem list. Problem list updated.  Objective  Blood pressure 126/82, weight 225 lb (102.1 kg), last menstrual period 02/26/2020. Pregravid weight 206 lb (93.4 kg) Total Weight Gain 19 lb (8.618 kg) Urinalysis: Urine Protein Negative  Urine Glucose Negative  Fetal Status: Fetal Heart Rate (bpm): 146 Fundal Height: 37 cm Movement: Present     General:  Alert, oriented and cooperative. Patient is in no acute distress.  Skin: Skin is warm and dry. No rash noted.   Cardiovascular: Normal heart rate noted  Respiratory: Normal respiratory effort, no problems with respiration noted  Abdomen: Soft, gravid, appropriate for gestational age. Pain/Pressure: Absent     Pelvic:  Cervical exam deferred        Extremities: Normal range of motion.  Edema: None  Mental Status: Normal mood and affect. Normal behavior. Normal  judgment and thought content.   Assessment   30 y.o. G1P0000 at [redacted]w[redacted]d by  12/21/2020, by Ultrasound presenting for routine prenatal visit  Plan   Pregnancy 1 Problems (from 05/03/20 to present)    Problem Noted Resolved   Obesity affecting pregnancy 06/27/2020 by Will Bonnet, MD No   Encounter for supervision of normal first pregnancy in third trimester 05/04/2020 by Orlie Pollen, CNM No   Overview Addendum 10/19/2020  4:44 PM by Homero Fellers, MD     Nursing Staff Provider  Office Location  [facility] Dating   by 7 wk [place]  Language  English Anatomy US  Complete early 09/2020  Flu Vaccine   declined Genetic Screen  NIPS: after 10 wks  TDaP vaccine   10/19/2020 Hgb A1C or  GTT Early : 122 Third trimester : 126  Rhogam   not needed   LAB RESULTS   Feeding Plan  breast Blood Type A/Positive/-- (03/17 1518)   Contraception  Antibody Negative (03/17 1518)  Circumcision  Rubella 3.34 (03/17 1518)  Pediatrician   RPR Non Reactive (03/17 1518)   Support Person  Merrily Pew - husband HBsAg Negative (03/17 1518)   Prenatal Classes  information provided HIV Non Reactive (03/17 1518)    Varicella  immune  BTL Consent  n/a GBS  (For PCN allergy, check sensitivities)        VBAC Consent  n/a Pap  2020, due postpartum    Hgb Electro   n/a    CF      SMA                   Preterm labor symptoms and general obstetric precautions including but not limited to vaginal bleeding, contractions, leaking of fluid and fetal movement were reviewed in detail with the patient. Please refer to After Visit Summary for other counseling recommendations.  Return in about 1 week (around 11/27/2020) for rob x 4 visits.  Rod Can, CNM 11/20/2020 4:54 PM

## 2020-11-27 ENCOUNTER — Other Ambulatory Visit: Payer: Self-pay

## 2020-11-27 ENCOUNTER — Other Ambulatory Visit (HOSPITAL_COMMUNITY)
Admission: RE | Admit: 2020-11-27 | Discharge: 2020-11-27 | Disposition: A | Discharge status: Home / Self Care | Payer: BC Managed Care – PPO | Source: Ambulatory Visit | Attending: Advanced Practice Midwife | Admitting: Advanced Practice Midwife

## 2020-11-27 ENCOUNTER — Ambulatory Visit (INDEPENDENT_AMBULATORY_CARE_PROVIDER_SITE_OTHER): Payer: BC Managed Care – PPO | Admitting: Advanced Practice Midwife

## 2020-11-27 ENCOUNTER — Encounter: Payer: Self-pay | Admitting: Advanced Practice Midwife

## 2020-11-27 VITALS — BP 118/76 | Wt 227.0 lb

## 2020-11-27 DIAGNOSIS — Z3A36 36 weeks gestation of pregnancy: Secondary | ICD-10-CM

## 2020-11-27 DIAGNOSIS — Z3403 Encounter for supervision of normal first pregnancy, third trimester: Secondary | ICD-10-CM | POA: Diagnosis not present

## 2020-11-27 DIAGNOSIS — Z369 Encounter for antenatal screening, unspecified: Secondary | ICD-10-CM

## 2020-11-27 DIAGNOSIS — Z113 Encounter for screening for infections with a predominantly sexual mode of transmission: Secondary | ICD-10-CM | POA: Insufficient documentation

## 2020-11-27 DIAGNOSIS — Z3685 Encounter for antenatal screening for Streptococcus B: Secondary | ICD-10-CM

## 2020-11-27 LAB — POCT URINALYSIS DIPSTICK OB
Glucose, UA: NEGATIVE
POC,PROTEIN,UA: NEGATIVE

## 2020-11-27 LAB — OB RESULTS CONSOLE GC/CHLAMYDIA: Gonorrhea: NEGATIVE

## 2020-11-27 NOTE — Progress Notes (Signed)
ROB - GBS, no concerns. RM 4

## 2020-11-27 NOTE — Progress Notes (Signed)
Routine Prenatal Care Visit  Subjective  Ariel Thompson is a 30 y.o. G1P0000 at [redacted]w[redacted]d being seen today for ongoing prenatal care.  She is currently monitored for the following issues for this low-risk pregnancy and has Allergic rhinitis; ADD (attention deficit hyperactivity disorder, inattentive type); Family planning; Irregular bleeding; Headache, migraine; Cyst of ovary; Encounter for supervision of normal first pregnancy in third trimester; BMI 34.0-34.9,adult; Nausea/vomiting in pregnancy; Depression; Anxiety; and Obesity affecting pregnancy on their problem list.  ----------------------------------------------------------------------------------- Patient reports general discomforts of third trimester and overall doing well.    .  .   Ariel Thompson Fluid denies.  ----------------------------------------------------------------------------------- The following portions of the patient's history were reviewed and updated as appropriate: allergies, current medications, past family history, past medical history, past social history, past surgical history and problem list. Problem list updated.  Objective  Blood pressure 118/76, weight 227 lb (103 kg), last menstrual period 02/26/2020. Pregravid weight 206 lb (93.4 kg) Total Weight Gain 21 lb (9.526 kg) Urinalysis: Urine Protein    Urine Glucose    Fetal Status:           General:  Alert, oriented and cooperative. Patient is in no acute distress.  Skin: Skin is warm and dry. No rash noted.   Cardiovascular: Normal heart rate noted  Respiratory: Normal respiratory effort, no problems with respiration noted  Abdomen: Soft, gravid, appropriate for gestational age.       Pelvic:  Cervical exam deferred      GBS/Aptima collected  Extremities: Normal range of motion.     Mental Status: Normal mood and affect. Normal behavior. Normal judgment and thought content.   Assessment   30 y.o. G1P0000 at [redacted]w[redacted]d by  12/21/2020, by Ultrasound presenting for  routine prenatal visit  Plan   Pregnancy 1 Problems (from 05/03/20 to present)    Problem Noted Resolved   Obesity affecting pregnancy 06/27/2020 by Will Bonnet, MD No   Encounter for supervision of normal first pregnancy in third trimester 05/04/2020 by Orlie Pollen, CNM No   Overview Addendum 10/19/2020  4:44 PM by Homero Fellers, MD     Nursing Staff Provider  Office Location  [facility] Dating   by 7 wk [place]  Language  English Anatomy US  Complete early 09/2020  Flu Vaccine   declined Genetic Screen  NIPS: after 10 wks  TDaP vaccine   10/19/2020 Hgb A1C or  GTT Early : 122 Third trimester : 126  Rhogam   not needed   LAB RESULTS   Feeding Plan  breast Blood Type A/Positive/-- (03/17 1518)   Contraception  Antibody Negative (03/17 1518)  Circumcision  Rubella 3.34 (03/17 1518)  Pediatrician   RPR Non Reactive (03/17 1518)   Support Person  Merrily Pew - husband HBsAg Negative (03/17 1518)   Prenatal Classes  information provided HIV Non Reactive (03/17 1518)    Varicella  immune  BTL Consent  n/a GBS  (For PCN allergy, check sensitivities)        VBAC Consent  n/a Pap  2020, due postpartum    Hgb Electro   n/a    CF      SMA                   Preterm labor symptoms and general obstetric precautions including but not limited to vaginal bleeding, contractions, leaking of fluid and fetal movement were reviewed in detail with the patient.   Return in about 1 week (around 12/04/2020) for  rob.  Rod Can, CNM 11/27/2020 4:49 PM

## 2020-11-29 LAB — CERVICOVAGINAL ANCILLARY ONLY
Chlamydia: NEGATIVE
Comment: NEGATIVE
Comment: NEGATIVE
Comment: NORMAL
Neisseria Gonorrhea: NEGATIVE
Trichomonas: NEGATIVE

## 2020-11-30 LAB — STREP GP B NAA: Strep Gp B NAA: NEGATIVE

## 2020-12-06 ENCOUNTER — Ambulatory Visit (INDEPENDENT_AMBULATORY_CARE_PROVIDER_SITE_OTHER): Payer: BC Managed Care – PPO | Admitting: Advanced Practice Midwife

## 2020-12-06 ENCOUNTER — Other Ambulatory Visit: Payer: Self-pay

## 2020-12-06 ENCOUNTER — Encounter: Payer: Self-pay | Admitting: Advanced Practice Midwife

## 2020-12-06 VITALS — BP 126/84 | Wt 228.0 lb

## 2020-12-06 DIAGNOSIS — Z3A37 37 weeks gestation of pregnancy: Secondary | ICD-10-CM

## 2020-12-06 DIAGNOSIS — Z3403 Encounter for supervision of normal first pregnancy, third trimester: Secondary | ICD-10-CM

## 2020-12-06 NOTE — Progress Notes (Signed)
No vb. No lof.  

## 2020-12-06 NOTE — Progress Notes (Signed)
Routine Prenatal Care Visit  Subjective  Ariel Thompson is a 30 y.o. G1P0000 at [redacted]w[redacted]d being seen today for ongoing prenatal care.  She is currently monitored for the following issues for this low-risk pregnancy and has Allergic rhinitis; ADD (attention deficit hyperactivity disorder, inattentive type); Family planning; Irregular bleeding; Headache, migraine; Cyst of ovary; Encounter for supervision of normal first pregnancy in third trimester; BMI 34.0-34.9,adult; Nausea/vomiting in pregnancy; Depression; Anxiety; and Obesity affecting pregnancy on their problem list.  ----------------------------------------------------------------------------------- Patient reports "hanging in there".   Contractions: Irregular. Vag. Bleeding: None.  Movement: Present. Leaking Fluid denies.  ----------------------------------------------------------------------------------- The following portions of the patient's history were reviewed and updated as appropriate: allergies, current medications, past family history, past medical history, past social history, past surgical history and problem list. Problem list updated.  Objective  Blood pressure 126/84, weight 228 lb (103.4 kg), last menstrual period 02/26/2020. Pregravid weight 206 lb (93.4 kg) Total Weight Gain 22 lb (9.979 kg) Urinalysis: Urine Protein    Urine Glucose    Fetal Status: Fetal Heart Rate (bpm): 126 Fundal Height: 38 cm Movement: Present  Presentation: Vertex  General:  Alert, oriented and cooperative. Patient is in no acute distress.  Skin: Skin is warm and dry. No rash noted.   Cardiovascular: Normal heart rate noted  Respiratory: Normal respiratory effort, no problems with respiration noted  Abdomen: Soft, gravid, appropriate for gestational age. Pain/Pressure: Absent     Pelvic:  Cervical exam performed Dilation: 1 Effacement (%): 50 Station: -3  Extremities: Normal range of motion.  Edema: Trace  Mental Status: Normal mood and  affect. Normal behavior. Normal judgment and thought content.   Assessment   30 y.o. G1P0000 at [redacted]w[redacted]d by  12/21/2020, by Ultrasound presenting for routine prenatal visit  Plan   Pregnancy 1 Problems (from 05/03/20 to present)    Problem Noted Resolved   Obesity affecting pregnancy 06/27/2020 by Will Bonnet, MD No   Encounter for supervision of normal first pregnancy in third trimester 05/04/2020 by Orlie Pollen, CNM No   Overview Addendum 10/19/2020  4:44 PM by Homero Fellers, MD     Nursing Staff Provider  Office Location  [facility] Dating   by 7 wk [place]  Language  English Anatomy US  Complete early 09/2020  Flu Vaccine   declined Genetic Screen  NIPS: after 10 wks  TDaP vaccine   10/19/2020 Hgb A1C or  GTT Early : 122 Third trimester : 126  Rhogam   not needed   LAB RESULTS   Feeding Plan  breast Blood Type A/Positive/-- (03/17 1518)   Contraception  Antibody Negative (03/17 1518)  Circumcision  Rubella 3.34 (03/17 1518)  Pediatrician   RPR Non Reactive (03/17 1518)   Support Person  Merrily Pew - husband HBsAg Negative (03/17 1518)   Prenatal Classes  information provided HIV Non Reactive (03/17 1518)    Varicella  immune  BTL Consent  n/a GBS  (For PCN allergy, check sensitivities)        VBAC Consent  n/a Pap  2020, due postpartum    Hgb Electro   n/a    CF      SMA                   Preterm labor symptoms and general obstetric precautions including but not limited to vaginal bleeding, contractions, leaking of fluid and fetal movement were reviewed in detail with the patient. Please refer to After Visit Summary for other counseling recommendations.  Return in about 1 week (around 12/13/2020) for rob.  Rod Can, CNM 12/06/2020 4:35 PM

## 2020-12-14 ENCOUNTER — Ambulatory Visit (INDEPENDENT_AMBULATORY_CARE_PROVIDER_SITE_OTHER): Payer: BC Managed Care – PPO | Admitting: Obstetrics and Gynecology

## 2020-12-14 ENCOUNTER — Other Ambulatory Visit: Payer: Self-pay

## 2020-12-14 ENCOUNTER — Encounter: Payer: Self-pay | Admitting: Obstetrics and Gynecology

## 2020-12-14 VITALS — BP 126/80 | Wt 228.0 lb

## 2020-12-14 DIAGNOSIS — O99213 Obesity complicating pregnancy, third trimester: Secondary | ICD-10-CM

## 2020-12-14 DIAGNOSIS — Z3403 Encounter for supervision of normal first pregnancy, third trimester: Secondary | ICD-10-CM

## 2020-12-14 DIAGNOSIS — Z3A39 39 weeks gestation of pregnancy: Secondary | ICD-10-CM

## 2020-12-14 NOTE — Progress Notes (Signed)
Routine Prenatal Care Visit  Subjective  Ariel Thompson is a 30 y.o. G1P0000 at [redacted]w[redacted]d being seen today for ongoing prenatal care.  She is currently monitored for the following issues for this low-risk pregnancy and has Allergic rhinitis; ADD (attention deficit hyperactivity disorder, inattentive type); Family planning; Irregular bleeding; Headache, migraine; Cyst of ovary; Encounter for supervision of normal first pregnancy in third trimester; BMI 34.0-34.9,adult; Nausea/vomiting in pregnancy; Depression; Anxiety; and Obesity affecting pregnancy on their problem list.  ----------------------------------------------------------------------------------- Patient reports no complaints.   Contractions: Irregular. Vag. Bleeding: None.  Movement: Present. Leaking Fluid denies.  ----------------------------------------------------------------------------------- The following portions of the patient's history were reviewed and updated as appropriate: allergies, current medications, past family history, past medical history, past social history, past surgical history and problem list. Problem list updated.  Objective  Blood pressure 126/80, weight 228 lb (103.4 kg), last menstrual period 02/26/2020. Pregravid weight 206 lb (93.4 kg) Total Weight Gain 22 lb (9.979 kg) Urinalysis: Urine Protein    Urine Glucose    Fetal Status: Fetal Heart Rate (bpm): 140 Fundal Height: 39 cm Movement: Present  Presentation: Vertex  General:  Alert, oriented and cooperative. Patient is in no acute distress.  Skin: Skin is warm and dry. No rash noted.   Cardiovascular: Normal heart rate noted  Respiratory: Normal respiratory effort, no problems with respiration noted  Abdomen: Soft, gravid, appropriate for gestational age. Pain/Pressure: Absent     Pelvic:  Cervical exam performed Dilation: 1 Effacement (%): 60 Station: -3  Extremities: Normal range of motion.  Edema: Trace  Mental Status: Normal mood and affect.  Normal behavior. Normal judgment and thought content.   Female chaperone present for pelvic exam:   Assessment   30 y.o. G1P0000 at [redacted]w[redacted]d by  12/21/2020, by Ultrasound presenting for routine prenatal visit  Plan   Pregnancy 1 Problems (from 05/03/20 to present)     Problem Noted Resolved   Obesity affecting pregnancy 06/27/2020 by Will Bonnet, MD No   Encounter for supervision of normal first pregnancy in third trimester 05/04/2020 by Orlie Pollen, CNM No   Overview Addendum 10/19/2020  4:44 PM by Homero Fellers, MD     Nursing Staff Provider  Office Location  [facility] Dating   by 7 wk [place]  Language  English Anatomy US  Complete early 09/2020  Flu Vaccine   declined Genetic Screen  NIPS: after 10 wks  TDaP vaccine   10/19/2020 Hgb A1C or  GTT Early : 122 Third trimester : 126  Rhogam   not needed   LAB RESULTS   Feeding Plan  breast Blood Type A/Positive/-- (03/17 1518)   Contraception  Antibody Negative (03/17 1518)  Circumcision  Rubella 3.34 (03/17 1518)  Pediatrician   RPR Non Reactive (03/17 1518)   Support Person  Merrily Pew - husband HBsAg Negative (03/17 1518)   Prenatal Classes  information provided HIV Non Reactive (03/17 1518)    Varicella  immune  BTL Consent  n/a GBS  (For PCN allergy, check sensitivities)        VBAC Consent  n/a Pap  2020, due postpartum    Hgb Electro   n/a    CF      SMA                   Term labor symptoms and general obstetric precautions including but not limited to vaginal bleeding, contractions, leaking of fluid and fetal movement were reviewed in detail with the patient. Please refer  to After Visit Summary for other counseling recommendations.   Return in about 1 week (around 12/21/2020) for [place].   Prentice Docker, MD, [facility], [facility] 12/14/2020 4:31 PM

## 2020-12-15 ENCOUNTER — Other Ambulatory Visit: Payer: Self-pay

## 2020-12-15 ENCOUNTER — Observation Stay
Admission: EM | Admit: 2020-12-15 | Discharge: 2020-12-16 | Disposition: A | Discharge status: Home / Self Care | Payer: BC Managed Care – PPO | Attending: Obstetrics and Gynecology | Admitting: Obstetrics and Gynecology

## 2020-12-15 ENCOUNTER — Encounter: Payer: Self-pay | Admitting: Obstetrics and Gynecology

## 2020-12-15 DIAGNOSIS — Z3403 Encounter for supervision of normal first pregnancy, third trimester: Secondary | ICD-10-CM

## 2020-12-15 DIAGNOSIS — O479 False labor, unspecified: Principal | ICD-10-CM | POA: Insufficient documentation

## 2020-12-15 DIAGNOSIS — O99213 Obesity complicating pregnancy, third trimester: Secondary | ICD-10-CM

## 2020-12-15 NOTE — OB Triage Note (Addendum)
Pt is a G1P0 and [redacted]w[redacted]d presenting to L&D with c/o upper abdominal discomfort that started at [place]. Pt reports pain 4/10 on a 0-10 pain scale. Pt reports it as "squeezing" bilaterally and that it is constant, but "eases at times." Pt reports that it feels "difficult to take a deep breath with the squeezing." Pt states her friend recommend her take her blood pressure tonight and that her reading was 127/98, so she called the nurse triage line due to concern of the diastolic number being elevated. Pt said she felt nauseas earlier, but it got better once she ate dinner. Pt confirms positive fetal movement and denies ctx or LOF. Pt denies visual changes, headaches or sudden weight gain. Monitors applied and assessing. Initial BP 141/86, blood pressure set to cycle every 15 minutes.

## 2020-12-16 DIAGNOSIS — O479 False labor, unspecified: Secondary | ICD-10-CM | POA: Diagnosis present

## 2020-12-16 LAB — COMPREHENSIVE METABOLIC PANEL
ALT: 15 U/L (ref 0–44)
AST: 18 U/L (ref 15–41)
Albumin: 3.3 g/dL — ABNORMAL LOW (ref 3.5–5.0)
Alkaline Phosphatase: 164 U/L — ABNORMAL HIGH (ref 38–126)
Anion gap: 9 (ref 5–15)
BUN: 7 mg/dL (ref 6–20)
CO2: 23 mmol/L (ref 22–32)
Calcium: 9.4 mg/dL (ref 8.9–10.3)
Chloride: 103 mmol/L (ref 98–111)
Creatinine, Ser: 0.53 mg/dL (ref 0.44–1.00)
GFR, Estimated: >60 mL/min (ref >60)
Glucose, Bld: 113 mg/dL — ABNORMAL HIGH (ref 70–99)
Potassium: 3.7 mmol/L (ref 3.5–5.1)
Sodium: 135 mmol/L (ref 135–145)
Total Bilirubin: 0.4 mg/dL (ref 0.3–1.2)
Total Protein: 6.9 g/dL (ref 6.5–8.1)

## 2020-12-16 LAB — CBC
HCT: 31.6 % — ABNORMAL LOW (ref 36.0–46.0)
Hemoglobin: 11 g/dL — ABNORMAL LOW (ref 12.0–15.0)
MCH: 27.9 pg (ref 26.0–34.0)
MCHC: 34.8 g/dL (ref 30.0–36.0)
MCV: 80.2 fL (ref 80.0–100.0)
Platelets: 384 10*3/uL (ref 150–400)
RBC: 3.94 MIL/uL (ref 3.87–5.11)
RDW: 12.5 % (ref 11.5–15.5)
WBC: 12.1 10*3/uL — ABNORMAL HIGH (ref 4.0–10.5)
nRBC: 0 % (ref 0.0–0.2)

## 2020-12-16 LAB — PROTEIN / CREATININE RATIO, URINE
Creatinine, Urine: 65 mg/dL
Protein Creatinine Ratio: 0.15 mg/mg{Cre} (ref 0.00–0.15)
Total Protein, Urine: 10 mg/dL

## 2020-12-16 MED ORDER — LACTATED RINGERS IV BOLUS
1000.0000 mL | Freq: Once | INTRAVENOUS | Status: AC
  Administered 2020-12-16: 1000 mL via INTRAVENOUS

## 2020-12-16 NOTE — OB Triage Note (Signed)
Discharge instructions reviewed and patient verbalized understanding. Patient stable at time of discharge and follow up care reviewed. Patient discharged home with significant other.

## 2020-12-20 ENCOUNTER — Other Ambulatory Visit: Payer: Self-pay

## 2020-12-20 ENCOUNTER — Encounter: Payer: Self-pay | Admitting: Obstetrics and Gynecology

## 2020-12-20 ENCOUNTER — Ambulatory Visit (INDEPENDENT_AMBULATORY_CARE_PROVIDER_SITE_OTHER): Payer: BC Managed Care – PPO | Admitting: Obstetrics and Gynecology

## 2020-12-20 VITALS — BP 122/84 | Wt 226.0 lb

## 2020-12-20 DIAGNOSIS — Z3A39 39 weeks gestation of pregnancy: Secondary | ICD-10-CM

## 2020-12-20 DIAGNOSIS — O99213 Obesity complicating pregnancy, third trimester: Secondary | ICD-10-CM

## 2020-12-20 DIAGNOSIS — Z3403 Encounter for supervision of normal first pregnancy, third trimester: Secondary | ICD-10-CM

## 2020-12-20 NOTE — Progress Notes (Signed)
Routine Prenatal Care Visit  Subjective  Ariel Thompson is a 30 y.o. G1P0000 at [redacted]w[redacted]d being seen today for ongoing prenatal care.  She is currently monitored for the following issues for this low-risk pregnancy and has Allergic rhinitis; ADD (attention deficit hyperactivity disorder, inattentive type); Family planning; Irregular bleeding; Headache, migraine; Cyst of ovary; Encounter for supervision of normal first pregnancy in third trimester; BMI 34.0-34.9,adult; Nausea/vomiting in pregnancy; Depression; Anxiety; Obesity affecting pregnancy; and Uterine contractions on their problem list.  ----------------------------------------------------------------------------------- Patient reports no complaints.   Contractions: Irregular. Vag. Bleeding: None.  Movement: Present. Leaking Fluid denies.  ----------------------------------------------------------------------------------- The following portions of the patient's history were reviewed and updated as appropriate: allergies, current medications, past family history, past medical history, past social history, past surgical history and problem list. Problem list updated.  Objective  Blood pressure 122/84, weight 226 lb (102.5 kg), last menstrual period 02/26/2020. Pregravid weight 206 lb (93.4 kg) Total Weight Gain 20 lb (9.072 kg) Urinalysis: Urine Protein    Urine Glucose    Fetal Status: Fetal Heart Rate (bpm): 135 Fundal Height: 40 cm Movement: Present  Presentation: Vertex  General:  Alert, oriented and cooperative. Patient is in no acute distress.  Skin: Skin is warm and dry. No rash noted.   Cardiovascular: Normal heart rate noted  Respiratory: Normal respiratory effort, no problems with respiration noted  Abdomen: Soft, gravid, appropriate for gestational age. Pain/Pressure: Present     Pelvic:  Cervical exam performed Dilation: 2 Effacement (%): 60 Station: -3  Extremities: Normal range of motion.     Mental Status: Normal mood and  affect. Normal behavior. Normal judgment and thought content.   Female chaperone present for pelvic exam:   Assessment   30 y.o. G1P0000 at [redacted]w[redacted]d by  12/21/2020, by Ultrasound presenting for routine prenatal visit  Plan   Pregnancy 1 Problems (from 05/03/20 to present)     Problem Noted Resolved   Obesity affecting pregnancy 06/27/2020 by Will Bonnet, MD No   Encounter for supervision of normal first pregnancy in third trimester 05/04/2020 by Orlie Pollen, CNM No   Overview Addendum 10/19/2020  4:44 PM by Homero Fellers, MD     Nursing Staff Provider  Office Location  [facility] Dating   by 7 wk [place]  Language  English Anatomy US  Complete early 09/2020  Flu Vaccine   declined Genetic Screen  NIPS: after 10 wks  TDaP vaccine   10/19/2020 Hgb A1C or  GTT Early : 122 Third trimester : 126  Rhogam   not needed   LAB RESULTS   Feeding Plan  breast Blood Type A/Positive/-- (03/17 1518)   Contraception  Antibody Negative (03/17 1518)  Circumcision  Rubella 3.34 (03/17 1518)  Pediatrician   RPR Non Reactive (03/17 1518)   Support Person  Merrily Pew - husband HBsAg Negative (03/17 1518)   Prenatal Classes  information provided HIV Non Reactive (03/17 1518)    Varicella  immune  BTL Consent  n/a GBS  (For PCN allergy, check sensitivities)        VBAC Consent  n/a Pap  2020, due postpartum    Hgb Electro   n/a    CF      SMA                   Term labor symptoms and general obstetric precautions including but not limited to vaginal bleeding, contractions, leaking of fluid and fetal movement were reviewed in detail with the patient.  Please refer to After Visit Summary for other counseling recommendations.   IOL set up for 11/15 at 8am Return in about 1 week (around 12/27/2020) for ROB.   Prentice Docker, MD, [facility], [facility] 12/20/2020 5:51 PM

## 2020-12-21 NOTE — Patient Instructions (Signed)
Induction Information: Your induction date: 01/01/2021 at [place] test: 11/14 between 9-10AM. [facility]  [facility] inside [facility] and follow the signs.  On your induction date, go to the ER a little before your induction time and let them know you're there for your labor induction.

## 2020-12-23 ENCOUNTER — Inpatient Hospital Stay: Payer: BC Managed Care – PPO | Admitting: Anesthesiology

## 2020-12-23 ENCOUNTER — Other Ambulatory Visit: Payer: Self-pay

## 2020-12-23 ENCOUNTER — Encounter: Payer: Self-pay | Admitting: Obstetrics & Gynecology

## 2020-12-23 ENCOUNTER — Inpatient Hospital Stay
Admission: EM | Admit: 2020-12-23 | Discharge: 2020-12-25 | DRG: 807 | Disposition: A | Discharge status: Home / Self Care | Payer: BC Managed Care – PPO | Attending: Obstetrics & Gynecology | Admitting: Obstetrics & Gynecology

## 2020-12-23 DIAGNOSIS — Z3A40 40 weeks gestation of pregnancy: Secondary | ICD-10-CM

## 2020-12-23 DIAGNOSIS — Z3403 Encounter for supervision of normal first pregnancy, third trimester: Secondary | ICD-10-CM

## 2020-12-23 DIAGNOSIS — R198 Other specified symptoms and signs involving the digestive system and abdomen: Secondary | ICD-10-CM | POA: Diagnosis present

## 2020-12-23 DIAGNOSIS — O99213 Obesity complicating pregnancy, third trimester: Secondary | ICD-10-CM

## 2020-12-23 DIAGNOSIS — O480 Post-term pregnancy: Secondary | ICD-10-CM

## 2020-12-23 DIAGNOSIS — O99214 Obesity complicating childbirth: Secondary | ICD-10-CM | POA: Diagnosis present

## 2020-12-23 DIAGNOSIS — Z20822 Contact with and (suspected) exposure to covid-19: Secondary | ICD-10-CM | POA: Diagnosis present

## 2020-12-23 DIAGNOSIS — O26893 Other specified pregnancy related conditions, third trimester: Secondary | ICD-10-CM | POA: Diagnosis present

## 2020-12-23 LAB — RESP PANEL BY RT-PCR (FLU A&B, COVID) ARPGX2
Influenza A by PCR: NEGATIVE
Influenza B by PCR: NEGATIVE
SARS Coronavirus 2 by RT PCR: NEGATIVE

## 2020-12-23 LAB — TYPE AND SCREEN
ABO/RH(D): A POS
Antibody Screen: NEGATIVE

## 2020-12-23 LAB — ABO/RH: ABO/RH(D): A POS

## 2020-12-23 LAB — CBC
HCT: 30.6 % — ABNORMAL LOW (ref 36.0–46.0)
Hemoglobin: 10.3 g/dL — ABNORMAL LOW (ref 12.0–15.0)
MCH: 26.8 pg (ref 26.0–34.0)
MCHC: 33.7 g/dL (ref 30.0–36.0)
MCV: 79.5 fL — ABNORMAL LOW (ref 80.0–100.0)
Platelets: 358 10*3/uL (ref 150–400)
RBC: 3.85 MIL/uL — ABNORMAL LOW (ref 3.87–5.11)
RDW: 12.9 % (ref 11.5–15.5)
WBC: 13.1 10*3/uL — ABNORMAL HIGH (ref 4.0–10.5)
nRBC: 0 % (ref 0.0–0.2)

## 2020-12-23 LAB — RAPID HIV SCREEN (HIV 1/2 AB+AG)
HIV 1/2 Antibodies: NONREACTIVE
HIV-1 P24 Antigen - HIV24: NONREACTIVE

## 2020-12-23 MED ORDER — SODIUM CHLORIDE 0.9% FLUSH: 3.0000 mL | INTRAVENOUS | Status: DC | PRN

## 2020-12-23 MED ORDER — OXYTOCIN BOLUS FROM INFUSION
333.0000 mL | Freq: Once | INTRAVENOUS | Status: AC
  Administered 2020-12-23: 333 mL via INTRAVENOUS

## 2020-12-23 MED ORDER — SENNOSIDES-DOCUSATE SODIUM 8.6-50 MG PO TABS
2.0000 | ORAL_TABLET | ORAL | Status: DC
  Administered 2020-12-23 – 2020-12-24 (×2): 2 via ORAL
  Filled 2020-12-23 (×2): qty 2

## 2020-12-23 MED ORDER — SODIUM CHLORIDE 0.9 % IV SOLN: 250.0000 mL | INTRAVENOUS | Status: DC | PRN

## 2020-12-23 MED ORDER — ACETAMINOPHEN 325 MG PO TABS: 650.0000 mg | ORAL_TABLET | ORAL | Status: DC | PRN

## 2020-12-23 MED ORDER — LACTATED RINGERS IV SOLN
500.0000 mL | INTRAVENOUS | Status: DC | PRN
  Administered 2020-12-23: 500 mL via INTRAVENOUS

## 2020-12-23 MED ORDER — EPHEDRINE 5 MG/ML INJ: 10.0000 mg | INTRAVENOUS | Status: DC | PRN

## 2020-12-23 MED ORDER — MISOPROSTOL 200 MCG PO TABS
ORAL_TABLET | ORAL | Status: AC
  Filled 2020-12-23: qty 4

## 2020-12-23 MED ORDER — DIBUCAINE (PERIANAL) 1 % EX OINT: 1.0000 "application " | TOPICAL_OINTMENT | CUTANEOUS | Status: DC | PRN

## 2020-12-23 MED ORDER — ONDANSETRON HCL 4 MG PO TABS: 4.0000 mg | ORAL_TABLET | ORAL | Status: DC | PRN

## 2020-12-23 MED ORDER — LACTATED RINGERS IV SOLN: INTRAVENOUS | Status: DC

## 2020-12-23 MED ORDER — ZOLPIDEM TARTRATE 5 MG PO TABS: 5.0000 mg | ORAL_TABLET | Freq: Every evening | ORAL | Status: DC | PRN

## 2020-12-23 MED ORDER — ACETAMINOPHEN 325 MG PO TABS
650.0000 mg | ORAL_TABLET | ORAL | Status: DC | PRN
  Administered 2020-12-23 – 2020-12-24 (×2): 650 mg via ORAL
  Filled 2020-12-23 (×2): qty 2

## 2020-12-23 MED ORDER — ONDANSETRON HCL 4 MG/2ML IJ SOLN: 4.0000 mg | Freq: Four times a day (QID) | INTRAMUSCULAR | Status: DC | PRN

## 2020-12-23 MED ORDER — FENTANYL-BUPIVACAINE-NACL 0.5-0.125-0.9 MG/250ML-% EP SOLN
EPIDURAL | Status: AC
  Filled 2020-12-23: qty 250

## 2020-12-23 MED ORDER — ONDANSETRON HCL 4 MG/2ML IJ SOLN: 4.0000 mg | INTRAMUSCULAR | Status: DC | PRN

## 2020-12-23 MED ORDER — SODIUM CHLORIDE 0.9% FLUSH
3.0000 mL | Freq: Two times a day (BID) | INTRAVENOUS | Status: DC
  Administered 2020-12-23: 3 mL via INTRAVENOUS

## 2020-12-23 MED ORDER — PHENYLEPHRINE 40 MCG/ML (10ML) SYRINGE FOR IV PUSH (FOR BLOOD PRESSURE SUPPORT): 80.0000 ug | PREFILLED_SYRINGE | INTRAVENOUS | Status: DC | PRN

## 2020-12-23 MED ORDER — DIPHENHYDRAMINE HCL 50 MG/ML IJ SOLN: 12.5000 mg | INTRAMUSCULAR | Status: DC | PRN

## 2020-12-23 MED ORDER — WITCH HAZEL-GLYCERIN EX PADS: 1.0000 "application " | MEDICATED_PAD | CUTANEOUS | Status: DC | PRN

## 2020-12-23 MED ORDER — BUTORPHANOL TARTRATE 1 MG/ML IJ SOLN: 1.0000 mg | INTRAMUSCULAR | Status: DC | PRN

## 2020-12-23 MED ORDER — FENTANYL-BUPIVACAINE-NACL 0.5-0.125-0.9 MG/250ML-% EP SOLN: 12.0000 mL/h | EPIDURAL | Status: DC | PRN

## 2020-12-23 MED ORDER — LACTATED RINGERS IV SOLN: 500.0000 mL | Freq: Once | INTRAVENOUS | Status: DC

## 2020-12-23 MED ORDER — COCONUT OIL OIL
1.0000 "application " | TOPICAL_OIL | Status: DC | PRN
  Filled 2020-12-23: qty 120

## 2020-12-23 MED ORDER — OXYTOCIN-SODIUM CHLORIDE 30-0.9 UT/500ML-% IV SOLN
2.5000 [IU]/h | INTRAVENOUS | Status: DC
  Filled 2020-12-23: qty 500

## 2020-12-23 MED ORDER — OXYCODONE-ACETAMINOPHEN 5-325 MG PO TABS: 2.0000 | ORAL_TABLET | ORAL | Status: DC | PRN

## 2020-12-23 MED ORDER — LIDOCAINE HCL (PF) 1 % IJ SOLN
30.0000 mL | INTRAMUSCULAR | Status: AC | PRN
  Administered 2020-12-23: 30 mL via SUBCUTANEOUS
  Filled 2020-12-23: qty 30

## 2020-12-23 MED ORDER — OXYTOCIN 10 UNIT/ML IJ SOLN
INTRAMUSCULAR | Status: AC
  Filled 2020-12-23: qty 2

## 2020-12-23 MED ORDER — BENZOCAINE-MENTHOL 20-0.5 % EX AERO
1.0000 "application " | INHALATION_SPRAY | CUTANEOUS | Status: DC | PRN
  Filled 2020-12-23 (×2): qty 56

## 2020-12-23 MED ORDER — IBUPROFEN 600 MG PO TABS
600.0000 mg | ORAL_TABLET | Freq: Four times a day (QID) | ORAL | Status: DC
  Administered 2020-12-23 – 2020-12-25 (×7): 600 mg via ORAL
  Filled 2020-12-23 (×8): qty 1

## 2020-12-23 MED ORDER — OXYCODONE-ACETAMINOPHEN 5-325 MG PO TABS: 1.0000 | ORAL_TABLET | ORAL | Status: DC | PRN

## 2020-12-23 MED ORDER — SIMETHICONE 80 MG PO CHEW: 80.0000 mg | CHEWABLE_TABLET | ORAL | Status: DC | PRN

## 2020-12-23 MED ORDER — AMMONIA AROMATIC IN INHA
RESPIRATORY_TRACT | Status: AC
  Filled 2020-12-23: qty 10

## 2020-12-23 MED ORDER — DIPHENHYDRAMINE HCL 25 MG PO CAPS: 25.0000 mg | ORAL_CAPSULE | Freq: Four times a day (QID) | ORAL | Status: DC | PRN

## 2020-12-23 NOTE — Progress Notes (Signed)
  Labor Progress Note   30 y.o. G1P0000 @ [redacted]w[redacted]d , admitted for  Pregnancy, Labor Management.   Subjective:  No pain after epidural  Objective:  BP 137/79   Pulse 95   Temp 98.3 F (36.8 C)   Resp 16   Ht 5\' 5"  (1.651 m)   Wt 103.4 kg   LMP 02/26/2020   SpO2 100%   BMI 37.94 kg/m  Abd: gravid, ND, FHT present, mild tenderness on exam Extr: trace to 1+ bilateral pedal edema SVE: CERVIX: 8 cm dilated, 90 effaced, +1 station, AROM clear, Vtx  EFM: FHR: 130 bpm, variability: moderate,  accelerations:  Present,  decelerations:  Absent Toco: Frequency: Every 3-4 minutes Labs: I have reviewed the patient's lab results.   Assessment & Plan:  G1P0000 @ [redacted]w[redacted]d, admitted for  Pregnancy and Labor/Delivery Management  1. Pain management: epidural. 2. FWB: FHT category 1.  3. ID: GBS negative 4. Labor management: AROM clear Anticpate second stage soon  All discussed with patient, see orders  Barnett Applebaum, MD, [facility], [facility] 12/23/2020  11:55 AM

## 2020-12-23 NOTE — H&P (Signed)
Obstetrics Admission History & Physical   No chief complaint on file.   HPI:  30 y.o. G1P0000 @ [redacted]w[redacted]d (12/21/2020, by Ultrasound). 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  No ROM or VB.  No complications this pregnancy,.   P[facility] care at: at [facility]. Pregnancy complicated by none.  ROS: A review of systems was performed and negative, except as stated in the above HPI. PMHx:  Past Medical History:  Diagnosis Date   Depression with anxiety    PSHx:  Past Surgical History:  Procedure Laterality Date   TONSILLECTOMY  2010   TYMPANOSTOMY TUBE PLACEMENT     Medications:  Medications Prior to Admission  Medication Sig Dispense Refill Last Dose   Doxylamine-Pyridoxine (DICLEGIS) 10-10 MG TBEC Take 2 tablets by mouth at bedtime. If symptoms persist, add one tablet in the morning and one in the afternoon 100 tablet 5 12/22/2020   DULoxetine (CYMBALTA) 20 MG capsule TAKE 1 CAPSULE BY MOUTH EVERY DAY 90 capsule 4 12/22/2020   esomeprazole (NEXIUM) 20 MG capsule Take 20 mg by mouth daily at 12 noon.   12/22/2020   ferrous sulfate 324 MG TBEC Take 324 mg by mouth.   Past Month   fexofenadine (ALLEGRA) 180 MG tablet Take 180 mg by mouth daily.   12/22/2020   Prenatal MV & Min w/FA-DHA (PRENATAL ADULT GUMMY/DHA/FA PO) Take by mouth.   12/22/2020    fluticasone (FLONASE SENSIMIST) 27.5 MCG/SPRAY nasal spray  (Patient not taking: No sig reported)   Completed Course   Omega Fatty Acids-Vitamins (OMEGA-3 GUMMIES) CHEW  (Patient not taking: No sig reported)   Completed Course   Allergies: is allergic to hydrocodone-acetaminophen. OBHx:  OB History  Gravida Para Term Preterm AB Living  1 0 0 0 0 0  SAB IAB Ectopic Multiple Live Births  0 0 0 0 0    # Outcome Date GA Lbr Len/2nd Weight Sex Delivery Anes PTL Lv  1 Current            NOM:VEHMCNOB/SJGGEZMOQHUT except as detailed in HPI.Marland Kitchen  No family history of birth defects. Soc Hx: Alcohol: none and Recreational drug use: none  Objective:   Vitals:   12/23/20 0615 12/23/20 0625  BP: 140/84 (!) 143/78  Pulse: 91 92  Resp:    Temp:     Constitutional: Well nourished, well developed female in no acute distress.  HEENT: normal Skin: Warm and dry.  Cardiovascular:Regular rate and rhythm.   Extremity: trace to 1+ bilateral pedal edema Respiratory: Clear to auscultation bilateral. Normal respiratory effort Abdomen: gravid, ND, FHT present, moderate tenderness on exam Back: no CVAT Neuro: DTRs 2+, Cranial nerves grossly intact Psych: Alert and Oriented x3. No memory deficits. Normal mood and affect.  MS: normal gait, normal bilateral lower extremity ROM/strength/stability.  Pelvic exam: is not limited by body habitus EGBUS: within normal limits Vagina: within normal limits and  with normal mucosa Cervix: CERVIX: 4-5 cm dilated,80 effaced, -2 station, presenting part Vtx MEMBRANES: intact Uterus: Spontaneous uterine activity  Adnexa: normal adnexa  EFM:FHR: 140 bpm, variability: moderate,  accelerations:  Present,  decelerations:  Absent Toco: Frequency: Every 4-5 minutes   Perinatal info:  Blood type: A positive Rubella- Immune Varicella -Immune TDaP Given during third trimester of this pregnancy RPR NR / HIV Neg/ HBsAg Neg   Assessment & Plan:   30 y.o. G1P0000 @ [redacted]w[redacted]d,  Admitted on 12/23/2020:Early active labor    Admit for labor, Observe for cervical change, Fetal Wellbeing Reassuring, Epidural when ready, and AROM when Appropriate  Barnett Applebaum, MD, [facility], [facility] 12/23/2020  7:57 AM

## 2020-12-23 NOTE — Anesthesia Procedure Notes (Signed)
Epidural Patient location during procedure: OB Start time: 12/23/2020 9:54 AM End time: 12/23/2020 10:14 AM  Staffing Anesthesiologist: Norval Gable, MD Performed: anesthesiologist   Preanesthetic Checklist Completed: patient identified, IV checked, site marked, risks and benefits discussed, surgical consent, monitors and equipment checked, pre-op evaluation and timeout performed  Epidural Patient position: sitting Prep: ChloraPrep Patient monitoring: heart rate, continuous pulse ox and blood pressure Approach: midline Location: L3-L4 Injection technique: LOR air  Needle:  Needle type: Tuohy  Needle gauge: 17 G Needle length: 9 cm and 9 Needle insertion depth: 6 cm Catheter type: closed end flexible Catheter size: 19 Gauge Catheter at skin depth: 12 cm Test dose: negative and 1.5% lidocaine with Epi 1:200 K  Assessment Sensory level: T10 Events: blood not aspirated, injection not painful, no injection resistance, no paresthesia and negative IV test  Additional Notes Reason for block:procedure for pain

## 2020-12-23 NOTE — OB Triage Note (Signed)
Pt is a G1P0 with c/o ctx 5-6 minutes apart. Pt denies fluid leaking or bleeding. Pt states that pain is primarily in her back.

## 2020-12-23 NOTE — Discharge Summary (Signed)
Postpartum Discharge Summary  Date of Service updated 12/25/2020     Patient Name: Ariel Thompson DOB: [birth year] MRN: [id number]  Date of admission: 12/23/2020 Delivery date:12/23/2020  Delivering provider: Gae Dry  Date of discharge: 12/25/2020  Admitting diagnosis: Abdominal complaints [R19.8] Normal labor and delivery [O80] Intrauterine pregnancy: [redacted]w[redacted]d    Secondary diagnosis:  Principal Problem:   Normal labor and delivery Active Problems:   Abdominal complaints   Postpartum care following vaginal delivery  Additional problems: none    Discharge diagnosis: Term Pregnancy Delivered                                              Post partum procedures: none Augmentation: AROM and Pitocin Complications: None  Hospital course: Onset of Labor With Vaginal Delivery      30y.o. yo G1P0000 at 460w2das admitted in Latent Labor on 12/23/2020. Patient had an uncomplicated labor course as follows:  Membrane Rupture Time/Date: 11:50 AM ,12/23/2020   Delivery Method:Vaginal, Spontaneous  Episiotomy: None  Lacerations:  2nd degree  Patient had an uncomplicated postpartum course.  She is ambulating, tolerating a regular diet, passing flatus, and urinating well. Patient is discharged home in stable condition on 12/25/20.  Newborn Data: Birth date:12/23/2020  Birth time:4:17 PM  Gender:Female  Living status:Living  Apgars: ,9  Weight:3640 g   Magnesium Sulfate received: No BMZ received: No Rhophylac:No MMR:No T-DaP:Given prenatally Flu: Yes Transfusion:No  Physical exam  Vitals:   12/24/20 0833 12/24/20 1213 12/24/20 1530 12/24/20 2306  BP: 120/71 128/86 126/75 129/69  Pulse: (!) 103 (!) 105 (!) 106 96  Resp: '20 18 20 18  ' Temp: 98.6 F (37 C) 98.1 F (36.7 C) 98.4 F (36.9 C) 98.5 F (36.9 C)  TempSrc: Oral Oral Oral Oral  SpO2: 100% 100% 97% 99%  Weight:      Height:       General: alert, cooperative, and no distress Lochia: appropriate Uterine  Fundus: firm Incision: Healing well with no significant drainage DVT Evaluation: No evidence of DVT seen on physical exam. Negative Homan's sign. Labs: Lab Results  Component Value Date   WBC 14.3 (H) 12/24/2020   HGB 8.2 (L) 12/24/2020   HCT 25.3 (L) 12/24/2020   MCV 80.6 12/24/2020   PLT 305 12/24/2020   CMP Latest Ref Rng & Units 12/16/2020  Glucose 70 - 99 mg/dL 113(H)  BUN 6 - 20 mg/dL 7  Creatinine 0.44 - 1.00 mg/dL 0.53  Sodium 135 - 145 mmol/L 135  Potassium 3.5 - 5.1 mmol/L 3.7  Chloride 98 - 111 mmol/L 103  CO2 22 - 32 mmol/L 23  Calcium 8.9 - 10.3 mg/dL 9.4  Total Protein 6.5 - 8.1 g/dL 6.9  Total Bilirubin 0.3 - 1.2 mg/dL 0.4  Alkaline Phos 38 - 126 U/L 164(H)  AST 15 - 41 U/L 18  ALT 0 - 44 U/L 15   Edinburgh Score: Edinburgh Postnatal Depression Scale Screening Tool 12/23/2020  I have been able to laugh and see the funny side of things. 0  I have looked forward with enjoyment to things. 0  I have blamed myself unnecessarily when things went wrong. 1  I have been anxious or worried for no good reason. 1  I have felt scared or panicky for no good reason. 0  Things have been getting on top  of me. 0  I have been so unhappy that I have had difficulty sleeping. 0  I have felt sad or miserable. 0  I have been so unhappy that I have been crying. 0  The thought of harming myself has occurred to me. 0  Edinburgh Postnatal Depression Scale Total 2      After visit meds:  Allergies as of 12/25/2020       Reactions   Hydrocodone-acetaminophen    tachycardia, anxiety, hallucinations.        Medication List     TAKE these medications    Doxylamine-Pyridoxine 10-10 MG Tbec Commonly known as: Diclegis Take 2 tablets by mouth at bedtime. If symptoms persist, add one tablet in the morning and one in the afternoon   DULoxetine 20 MG capsule Commonly known as: CYMBALTA TAKE 1 CAPSULE BY MOUTH EVERY DAY   esomeprazole 20 MG capsule Commonly known as:  NEXIUM Take 20 mg by mouth daily at 12 noon.   ferrous sulfate 324 MG Tbec Take 324 mg by mouth.   fexofenadine 180 MG tablet Commonly known as: ALLEGRA Take 180 mg by mouth daily.   Flonase Sensimist 27.5 MCG/SPRAY nasal spray Generic drug: fluticasone   ibuprofen 600 MG tablet Commonly known as: ADVIL Take 1 tablet (600 mg total) by mouth every 6 (six) hours.   Omega-3 Gummies Chew   oxyCODONE-acetaminophen 5-325 MG tablet Commonly known as: PERCOCET/ROXICET Take 1 tablet by mouth every 4 (four) hours as needed (pain scale 4-7).   PRENATAL ADULT GUMMY/DHA/FA PO Take by mouth.         Discharge home in stable condition Infant Feeding: Breast Infant Disposition:home with mother Discharge instruction: per After Visit Summary and Postpartum booklet. Activity: Advance as tolerated. Pelvic rest for 6 weeks.  Diet: routine diet Anticipated Birth Control: Unsure Postpartum Appointment:6 weeks Additional Postpartum F/U:  none Future Appointments: Future Appointments  Date Time Provider [place]  12/28/2020  4:30 PM Will Bonnet, MD [facility] None   Follow up Visit:  Follow-up Information     Gae Dry, MD. Schedule an appointment as soon as possible for a visit in 6 week(s).   Specialty: Obstetrics and Gynecology Why: For Post Partum Check Contact information: [address] [place] [address] [phone]                     12/25/2020 Imagene Riches, CNM

## 2020-12-23 NOTE — Anesthesia Preprocedure Evaluation (Signed)
Anesthesia Evaluation  Patient identified by MRN, date of birth, ID band Patient awake    Airway Mallampati: II   Neck ROM: Full    Dental no notable dental hx.    Pulmonary neg pulmonary ROS,           Cardiovascular negative cardio ROS   Rhythm:Regular Rate:Normal     Neuro/Psych  Headaches, Anxiety Depression    GI/Hepatic negative GI ROS, Neg liver ROS,   Endo/Other  negative endocrine ROS  Renal/GU negative Renal ROS     Musculoskeletal negative musculoskeletal ROS (+)   Abdominal   Peds  Hematology negative hematology ROS (+)   Anesthesia Other Findings   Reproductive/Obstetrics (+) Pregnancy                             Anesthesia Physical Anesthesia Plan  ASA: 2  Anesthesia Plan: Epidural   Post-op Pain Management:    Induction:   PONV Risk Score and Plan:   Airway Management Planned:   Additional Equipment:   Intra-op Plan:   Post-operative Plan:   Informed Consent: I have reviewed the patients History and Physical, chart, labs and discussed the procedure including the risks, benefits and alternatives for the proposed anesthesia with the patient or authorized representative who has indicated his/her understanding and acceptance.       Plan Discussed with:   Anesthesia Plan Comments:         Anesthesia Quick Evaluation

## 2020-12-24 LAB — CBC
HCT: 25.3 % — ABNORMAL LOW (ref 36.0–46.0)
Hemoglobin: 8.2 g/dL — ABNORMAL LOW (ref 12.0–15.0)
MCH: 26.1 pg (ref 26.0–34.0)
MCHC: 32.4 g/dL (ref 30.0–36.0)
MCV: 80.6 fL (ref 80.0–100.0)
Platelets: 305 10*3/uL (ref 150–400)
RBC: 3.14 MIL/uL — ABNORMAL LOW (ref 3.87–5.11)
RDW: 13.1 % (ref 11.5–15.5)
WBC: 14.3 10*3/uL — ABNORMAL HIGH (ref 4.0–10.5)
nRBC: 0 % (ref 0.0–0.2)

## 2020-12-24 LAB — RPR: RPR Ser Ql: NONREACTIVE

## 2020-12-24 MED ORDER — DULOXETINE HCL 20 MG PO CPEP
20.0000 mg | ORAL_CAPSULE | Freq: Every day | ORAL | Status: DC
  Administered 2020-12-24 – 2020-12-25 (×2): 20 mg via ORAL
  Filled 2020-12-24 (×2): qty 1

## 2020-12-24 NOTE — Anesthesia Postprocedure Evaluation (Signed)
Anesthesia Post Note  Patient: Ariel Thompson  Procedure(s) Performed: AN AD HOC LABOR EPIDURAL  Patient location during evaluation: Mother Baby Anesthesia Type: Epidural Level of consciousness: awake, awake and alert and oriented Pain management: pain level controlled Vital Signs Assessment: post-procedure vital signs reviewed and stable Respiratory status: spontaneous breathing, nonlabored ventilation and respiratory function stable Cardiovascular status: stable Postop Assessment: no headache, no backache, patient able to bend at knees and no apparent nausea or vomiting Anesthetic complications: no   No notable events documented.   Last Vitals:  Vitals:   12/23/20 2347 12/24/20 0412  BP: 125/90 (!) 141/79  Pulse: (!) 101 99  Resp: 20 20  Temp: 36.5 C 36.7 C  SpO2: 96% 98%    Last Pain:  Vitals:   12/24/20 0650  TempSrc:   PainSc: Asleep                 Armine Rizzolo,  [facility]

## 2020-12-24 NOTE — Progress Notes (Signed)
Post Partum Day 1 Subjective: no complaints, up ad lib, voiding, and tolerating PO She is expressing satisfaction with her birth experience Breastfeeding is going well.she is asking to continue her Cymbalta medication.  Objective: Blood pressure 120/71, pulse (!) 103, temperature 98.6 F (37 C), temperature source Oral, resp. rate 20, height 5\' 5"  (1.651 m), weight 103.4 kg, last menstrual period 02/26/2020, SpO2 100 %.  Physical Exam:  General: alert, cooperative, no distress, and moderately obese Lochia: appropriate Uterine Fundus: firm Incision: healing well DVT Evaluation: No evidence of DVT seen on physical exam. Negative Homan's sign.  Recent Labs    12/23/20 0823 12/24/20 0508  HGB 10.3* 8.2*  HCT 30.6* 25.3*    Assessment/Plan: Plan for discharge tomorrow, Breastfeeding, and Lactation consult Cymbalta 20 mg po ordered   LOS: 1 day   Ariel Thompson 12/24/2020, 11:27 AM

## 2020-12-24 NOTE — Lactation Note (Signed)
This note was copied from a baby's chart. Lactation Consultation Note  Patient Name: Ariel Thompson [id number] Date: 12/24/2020 Reason for consult: Initial assessment;Primapara;Term Age:29 hours  Lactation visit. Mom is P1, SVD 23 hours ago. Feedings and output have been documented since birth. Mom expresses concern with baby's latch this morning: "feels shallow, or she doesn't want to latch". LC observed mom attempted to feed- assisted with position (both cradle and football), support pillows, and hand expressing for interest. Baby would attempt latch, 2-4 sucks and then pull away.  Colostrum is easily hand expressed and leaking from opposite side- supply is not a concern. After multiple attempts baby brought to chest where she settled and started passing gas. LC hand expressed a spoonful (approximately 4-62mL) within 1-2 minutes and spoon feed infant.  LC noticed a pacifier in bassinet. LC and mom discussed the importance of no longer using the pacifier as this can be a hindrance with breastfeeding. We discussed the shape, the length, and the feel inside the mouth being different and creating confusion along with masking early hunger cues. Mom verbalized understanding.  LC encouraged continued frequent attempts, hand expression/collection of colostrum and spoon feeding as needed. Collection containers and spoons left at bedside.  Maternal Data Has patient been taught Hand Expression?: Yes Does the patient have breastfeeding experience prior to this delivery?: No  Feeding Mother's Current Feeding Choice: Breast Milk  LATCH Score Latch: Repeated attempts needed to sustain latch, nipple held in mouth throughout feeding, stimulation needed to elicit sucking reflex.  Audible Swallowing: None  Type of Nipple: Everted at rest and after stimulation  Comfort (Breast/Nipple): Soft / non-tender  Hold (Positioning): Assistance needed to correctly position infant at breast and maintain  latch.  LATCH Score: 6   Lactation Tools Discussed/Used    Interventions Interventions: Breast feeding basics reviewed;Assisted with latch;Hand express;Adjust position;Support pillows;Position options;Expressed milk;Education (spoon fed)  Discharge    Consult Status Consult Status: Follow-up Date: 12/24/20 Follow-up type: In-patient    Lavonia Drafts 12/24/2020, 4:06 PM

## 2020-12-25 MED ORDER — OXYCODONE-ACETAMINOPHEN 5-325 MG PO TABS: 1.0000 | ORAL_TABLET | ORAL | 0 refills | Status: DC | PRN

## 2020-12-25 MED ORDER — IBUPROFEN 600 MG PO TABS: 600.0000 mg | ORAL_TABLET | Freq: Four times a day (QID) | ORAL | 0 refills | Status: DC

## 2020-12-25 NOTE — Discharge Summary (Signed)
Physician Discharge Summary   Patient ID: Ariel Thompson [id number] 30 y.o. [birth year]  Admit date: 12/15/2020  Discharge date and time: 12/16/2020  3:20 AM   Admitting Physician: Homero Fellers, MD   Discharge Physician: Adrian Prows MD  Admission Diagnoses: Uterine contractions [O47.9]  Discharge Diagnoses: Uterine contractions  Admission Condition: good  Discharged Condition: good  Indication for Admission: Evaluation of abdominal pain in pregnancy  Hospital Course: Patient not found to be inn labor. Normal BP and preeclampsia labs normal. Discharged home in stable condition  Consults: None  Significant Diagnostic Studies: labs: see epic  Treatments: None  Discharge Exam: None performed  Disposition: Discharge disposition: 01-Home or Self Care       Patient Instructions:  Allergies as of 12/16/2020       Reactions   Hydrocodone-acetaminophen    tachycardia, anxiety, hallucinations.        Medication List     ASK your doctor about these medications    Doxylamine-Pyridoxine 10-10 MG Tbec Commonly known as: Diclegis Take 2 tablets by mouth at bedtime. If symptoms persist, add one tablet in the morning and one in the afternoon   DULoxetine 20 MG capsule Commonly known as: CYMBALTA TAKE 1 CAPSULE BY MOUTH EVERY DAY   ferrous sulfate 324 MG Tbec Take 324 mg by mouth.   fexofenadine 180 MG tablet Commonly known as: ALLEGRA Take 180 mg by mouth daily.   Flonase Sensimist 27.5 MCG/SPRAY nasal spray Generic drug: fluticasone   Omega-3 Gummies Chew   PRENATAL ADULT GUMMY/DHA/FA PO Take by mouth.       Activity: activity as tolerated Diet: regular diet Wound Care: none needed  Follow-up with [facility] as planned   Signed: [name] Ariel Thompson 12/25/2020 8:20 PM

## 2020-12-25 NOTE — Progress Notes (Signed)
Newborn discharged home.  Discharge instructions and appointment given to and reviewed with parent.  Parent verbalized understanding. All testing completed. Tag removed, bands matched. Escorted by auxillary, carseat present.

## 2020-12-28 ENCOUNTER — Encounter: Payer: BC Managed Care – PPO | Admitting: Obstetrics and Gynecology

## 2021-01-10 ENCOUNTER — Other Ambulatory Visit: Payer: Self-pay | Admitting: Family Medicine

## 2021-01-11 NOTE — Telephone Encounter (Signed)
Requested medication (s) are due for refill today: no  Requested medication (s) are on the active medication list: yes  Last refill:  last RF 12/22/19 #90 4 RF  Future visit scheduled: no  Notes to clinic:  requested med too early   Requested Prescriptions  Pending Prescriptions Disp Refills   DULoxetine (CYMBALTA) 20 MG capsule [Pharmacy Med Name: DULOXETINE HCL DR 20 MG CAP] 90 capsule 4    Sig: TAKE 1 [place]     Psychiatry: Antidepressants - SNRI Failed - 01/10/2021 10:30 AM      Failed - Completed PHQ-2 or PHQ-9 in the last 360 days      Failed - Valid encounter within last 6 months    Recent Outpatient Visits           1 year ago Adjustment reaction with anxiety   [facility] Jerrol Banana., MD   3 years ago Pleurisy   [facility] Birdie Sons, MD   3 years ago Chest pain, unspecified type   [facility] Jerrol Banana., MD   3 years ago [place] Jerrol Banana., MD   4 years ago [place] Jerrol Banana., MD              Passed - Last BP in normal range    BP Readings from Last 1 Encounters:  12/25/20 125/77

## 2021-01-16 ENCOUNTER — Telehealth: Payer: Self-pay | Admitting: *Deleted

## 2021-01-16 ENCOUNTER — Ambulatory Visit
Admission: RE | Admit: 2021-01-16 | Discharge: 2021-01-16 | Disposition: A | Discharge status: Home / Self Care | Payer: BC Managed Care – PPO | Source: Ambulatory Visit | Attending: Family Medicine | Admitting: Family Medicine

## 2021-01-16 ENCOUNTER — Other Ambulatory Visit: Payer: Self-pay

## 2021-01-16 ENCOUNTER — Ambulatory Visit: Payer: Self-pay

## 2021-01-16 ENCOUNTER — Encounter: Payer: Self-pay | Admitting: Family Medicine

## 2021-01-16 ENCOUNTER — Telehealth: Payer: Self-pay

## 2021-01-16 ENCOUNTER — Ambulatory Visit (INDEPENDENT_AMBULATORY_CARE_PROVIDER_SITE_OTHER): Payer: BC Managed Care – PPO | Admitting: Family Medicine

## 2021-01-16 ENCOUNTER — Ambulatory Visit: Admit: 2021-01-16 | Discharge status: Absent without leave | Payer: BC Managed Care – PPO

## 2021-01-16 VITALS — BP 126/82 | HR 94 | Temp 98.3°F

## 2021-01-16 DIAGNOSIS — R1010 Upper abdominal pain, unspecified: Secondary | ICD-10-CM | POA: Insufficient documentation

## 2021-01-16 DIAGNOSIS — R198 Other specified symptoms and signs involving the digestive system and abdomen: Secondary | ICD-10-CM

## 2021-01-16 DIAGNOSIS — F3342 Major depressive disorder, recurrent, in full remission: Secondary | ICD-10-CM

## 2021-01-16 DIAGNOSIS — Z711 Person with feared health complaint in whom no diagnosis is made: Secondary | ICD-10-CM | POA: Insufficient documentation

## 2021-01-16 DIAGNOSIS — K8020 Calculus of gallbladder without cholecystitis without obstruction: Secondary | ICD-10-CM

## 2021-01-16 DIAGNOSIS — N390 Urinary tract infection, site not specified: Secondary | ICD-10-CM | POA: Diagnosis not present

## 2021-01-16 MED ORDER — AMOXICILLIN 500 MG PO TABS: 500.0000 mg | ORAL_TABLET | Freq: Two times a day (BID) | ORAL | 0 refills | Status: DC

## 2021-01-16 NOTE — Telephone Encounter (Signed)
Pt. Went to ED last night with abdominal pain. Left due to long wait times. Pain comes and goes. Hurts upper abdomen and to the right. No availability per [place] - states pt. Should go to UC.     Reason for Disposition  [1] MILD-MODERATE pain AND [2] constant AND [3] present > 2 hours  Answer Assessment - Initial Assessment Questions 1. LOCATION: "Where does it hurt?"      Upper midline and to the right 2. RADIATION: "Does the pain shoot anywhere else?" (e.g., chest, back)     No 3. ONSET: "When did the pain begin?" (e.g., minutes, hours or days ago)      2 nights ago 4. SUDDEN: "Gradual or sudden onset?"     Gradual 5. PATTERN "Does the pain come and go, or is it constant?"    - If constant: "Is it getting better, staying the same, or worsening?"      (Note: Constant means the pain never goes away completely; most serious pain is constant and it progresses)     - If intermittent: "How long does it last?" "Do you have pain now?"     (Note: Intermittent means the pain goes away completely between bouts)     Comes and goes 6. SEVERITY: "How bad is the pain?"  (e.g., Scale 1-10; mild, moderate, or severe)   - MILD (1-3): doesn't interfere with normal activities, abdomen soft and not tender to touch    - MODERATE (4-7): interferes with normal activities or awakens from sleep, abdomen tender to touch    - SEVERE (8-10): excruciating pain, doubled over, unable to do any normal activities      Worse - 8 7. RECURRENT SYMPTOM: "Have you ever had this type of stomach pain before?" If Yes, ask: "When was the last time?" and "What happened that time?"      No 8. CAUSE: "What do you think is causing the stomach pain?"     Maybe gallbladder 9. RELIEVING/AGGRAVATING FACTORS: "What makes it better or worse?" (e.g., movement, antacids, bowel movement)     Eating makes it worse 10. OTHER SYMPTOMS: "Do you have any other symptoms?" (e.g., back pain, diarrhea, fever, urination pain, vomiting)        Vomiting with pain 11. PREGNANCY: "Is there any chance you are pregnant?" "When was your last menstrual period?"       No  Protocols used: Abdominal Pain - [facility]

## 2021-01-16 NOTE — Telephone Encounter (Signed)
FYI

## 2021-01-16 NOTE — Telephone Encounter (Signed)
Copied from [place] [phone]. Topic: Appointment Scheduling - Scheduling Inquiry for Clinic >> Jan 16, 2021  9:06 AM Tessa Lerner A wrote: Reason for CRM: The patient previously visited the Emergency Dept [facility]) and was directed to contact their PCP for a follow up appt   The patient's mother would like to be seen today if possible 01/16/21  Please contact further when possible

## 2021-01-16 NOTE — Telephone Encounter (Signed)
Patient was seen in office this morning. 

## 2021-01-16 NOTE — Telephone Encounter (Signed)
Ariel Thompson from [employer] calling with STAT imaging report.    IMPRESSION: Cholelithiasis and gallbladder wall thickening, suspicious for acute cholecystitis in the appropriate clinical setting. Report available in Epic.  CAlled practice, [place] to alert of STAT report.

## 2021-01-16 NOTE — Telephone Encounter (Signed)
Patient advised. Please schedule referral appointment.

## 2021-01-16 NOTE — Progress Notes (Signed)
Acute Office Visit  Subjective:    Patient ID: Ariel Thompson, female    DOB: 30-Apr-1990, 30 y.o.   MRN: [id number]  Chief Complaint  Patient presents with   Abdominal Pain    Started 2 nights ago pain about a 8  [facility] stated it was gallstones   Urinary Tract Infection    Patient has had a normal term pregnancy and gave birth via spontaneous vaginal delivery on 12/23/2020.  He and her daughter have done well and she is breast-feeding.  2 days ago she went to [place] and 2 hours later had significant epigastric and right upper quadrant pain.  It lasted about 2 hours.  Since then whenever she eats she has some postprandial pain in this area.  Because of the symptoms she was sent to the emergency room and had labs drawn and a urine done but because of such a long wait she went home.  She comes in concerned because of ongoing symptoms.  She did have a positive dip urine but also her lab work showed a white count of 12,000 with a left shift and elevated liver enzymes and alk phos of 305, ALT of 103, AST of 186    Past Medical History:  Diagnosis Date   Depression with anxiety     Past Surgical History:  Procedure Laterality Date   TONSILLECTOMY  2010   TYMPANOSTOMY TUBE PLACEMENT      Family History  Problem Relation Age of Onset   Asthma Sister    Migraines Sister    Allergic rhinitis Sister    Allergic rhinitis Brother    Migraines Brother    Heart Problems Brother        heart valve abnormality   Mitral valve prolapse Mother    Migraines Mother    Other Mother        IBS   Hypothyroidism Mother    Depression Father    Cancer Maternal Grandmother     Social History   Socioeconomic History   Marital status: Married    Spouse name: Not on file   Number of children: Not on file   Years of education: Not on file   Highest education level: Not on file  Occupational History   Not on file  Tobacco Use   Smoking status: Never   Smokeless tobacco: Never  Vaping  Use   Vaping Use: Never used  Substance and Sexual Activity   Alcohol use: Not Currently    Comment: occasional   Drug use: No   Sexual activity: Yes    Birth control/protection: Other-see comments    Comment: Undecided  Other Topics Concern   Not on file  Social History Narrative   Not on file   Social Determinants of Health   Financial Resource Strain: Not on file  Food Insecurity: Not on file  Transportation Needs: Not on file  Physical Activity: Not on file  Stress: Not on file  Social Connections: Not on file  Intimate Partner Violence: Not on file    Outpatient Medications Prior to Visit  Medication Sig Dispense Refill   docusate sodium (COLACE) 100 MG capsule Take 100 mg by mouth daily as needed for mild constipation.     esomeprazole (NEXIUM) 20 MG capsule Take 20 mg by mouth daily at 12 noon.     fexofenadine (ALLEGRA) 180 MG tablet Take 180 mg by mouth daily.     ibuprofen (ADVIL) 600 MG tablet Take 1 tablet (600 mg total)  by mouth every 6 (six) hours. 30 tablet 0   Doxylamine-Pyridoxine (DICLEGIS) 10-10 MG TBEC Take 2 tablets by mouth at bedtime. If symptoms persist, add one tablet in the morning and one in the afternoon (Patient not taking: Reported on 01/16/2021) 100 tablet 5   DULoxetine (CYMBALTA) 20 MG capsule Take 1 capsule (20 mg total) by mouth daily. (Patient not taking: Reported on 01/16/2021) 90 capsule 0   ferrous sulfate 324 MG TBEC Take 324 mg by mouth. (Patient not taking: Reported on 01/16/2021)     fluticasone (FLONASE SENSIMIST) 27.5 MCG/SPRAY nasal spray  (Patient not taking: Reported on 12/15/2020)     Omega Fatty Acids-Vitamins (OMEGA-3 GUMMIES) CHEW  (Patient not taking: Reported on 01/16/2021)     oxyCODONE-acetaminophen (PERCOCET/ROXICET) 5-325 MG tablet Take 1 tablet by mouth every 4 (four) hours as needed (pain scale 4-7). (Patient not taking: Reported on 01/16/2021) 20 tablet 0   Prenatal MV & Min w/FA-DHA (PRENATAL ADULT GUMMY/DHA/FA PO)  Take by mouth. (Patient not taking: Reported on 01/16/2021)     No facility-administered medications prior to visit.    Allergies  Allergen Reactions   Hydrocodone-Acetaminophen     tachycardia, anxiety, hallucinations.    Review of Systems     Objective:    Physical Exam Vitals reviewed.  Constitutional:      Appearance: She is well-developed.  HENT:     Head: Normocephalic and atraumatic.  Cardiovascular:     Rate and Rhythm: Normal rate and regular rhythm.     Pulses: Normal pulses.  Pulmonary:     Effort: Pulmonary effort is normal.     Breath sounds: Normal breath sounds.  Abdominal:     Palpations: Abdomen is soft.     Comments: She has mild epigastric tenderness and mild right upper quadrant tenderness with A mildly positive Murphy sign  Musculoskeletal:     Right lower leg: Normal.     Left lower leg: Normal.  Skin:    General: Skin is warm and dry.  Neurological:     General: No focal deficit present.     Mental Status: She is alert and oriented to person, place, and time.  Psychiatric:        Mood and Affect: Mood normal.        Behavior: Behavior normal.        Thought Content: Thought content normal.        Judgment: Judgment normal.    BP 126/82 (BP Location: Right Arm, Patient Position: Sitting, Cuff Size: Normal)   Pulse 94   Temp 98.3 F (36.8 C) (Temporal)   SpO2 99%  Wt Readings from Last 3 Encounters:  12/23/20 228 lb (103.4 kg)  12/20/20 226 lb (102.5 kg)  12/15/20 228 lb (103.4 kg)    Health Maintenance Due  Topic Date Due   COVID-19 Vaccine (1) Never done   Hepatitis C Screening  Never done    There are no preventive care reminders to display for this patient.   Lab Results  Component Value Date   TSH 3.780 10/02/2016   Lab Results  Component Value Date   WBC 14.3 (H) 12/24/2020   HGB 8.2 (L) 12/24/2020   HCT 25.3 (L) 12/24/2020   MCV 80.6 12/24/2020   PLT 305 12/24/2020   Lab Results  Component Value Date   NA 135  12/16/2020   K 3.7 12/16/2020   CO2 23 12/16/2020   GLUCOSE 113 (H) 12/16/2020   BUN 7 12/16/2020   CREATININE  0.53 12/16/2020   BILITOT 0.4 12/16/2020   ALKPHOS 164 (H) 12/16/2020   AST 18 12/16/2020   ALT 15 12/16/2020   PROT 6.9 12/16/2020   ALBUMIN 3.3 (L) 12/16/2020   CALCIUM 9.4 12/16/2020   ANIONGAP 9 12/16/2020   Lab Results  Component Value Date   CHOL 168 08/14/2016   Lab Results  Component Value Date   HDL 60 08/14/2016   Lab Results  Component Value Date   LDLCALC 92 08/14/2016   Lab Results  Component Value Date   TRIG 78 08/14/2016   No results found for: CHOLHDL Lab Results  Component Value Date   HGBA1C 5.2 08/14/2016       Assessment & Plan:   Problem List Items Addressed This Visit   None Visit Diagnoses     Pain of upper abdomen    -  Primary   Relevant Orders   US Abdomen Limited RUQ (LIVER/GB)   Concern about gallstones without diagnosis       Relevant Orders   US Abdomen Limited RUQ (LIVER/GB)   Urinary tract infection without hematuria, site unspecified       Relevant Medications   amoxicillin (AMOXIL) 500 MG tablet        1. Pain of upper abdomen This is gallbladder disease until proven otherwise.  Patient to remain clear liquid for gallbladder ultrasound.  Referral to surgeon will be made along with the ultrasound - US Abdomen Limited RUQ (LIVER/GB)  2. Concern about gallstones without diagnosis  - US Abdomen Limited RUQ (LIVER/GB)  3. Urinary tract infection without hematuria, site unspecified Go ahead and treat presumptively for 3 days - amoxicillin (AMOXIL) 500 MG tablet; Take 1 tablet (500 mg total) by mouth 2 (two) times daily.  Dispense: 12 tablet; Refill: 0  4. Recurrent major depressive disorder, in full remission ([place]) On duloxetine which has been discussed with her OB and her pediatrician as she is breast-feeding  5. Postpartum care following vaginal delivery Doing well and followed by [facility]    I,  Wilhemena Durie, MD, have reviewed all documentation for this visit. The documentation on 01/16/21 for the exam, diagnosis, procedures, and orders are all accurate and complete.  [name], [place]

## 2021-01-16 NOTE — Telephone Encounter (Signed)
Call report from [facility] [place]. Please review. Dr. Rosanna Randy is out of the office.   RUQ abdominal ultrasound results are in chart awaiting review. Please review and advise .   IMPRESSION: Cholelithiasis and gallbladder wall thickening, suspicious for acute cholecystitis in the appropriate clinical setting.

## 2021-01-16 NOTE — Telephone Encounter (Signed)
This is pt I mentioned.

## 2021-01-17 ENCOUNTER — Other Ambulatory Visit: Payer: Self-pay | Admitting: General Surgery

## 2021-01-17 NOTE — Telephone Encounter (Signed)
US of the gallbladder is suspicious for acute cholecystitis, an infection of the gallbladder. He vitals were stable at her visit with Dr Darnell Level yesterday, so we can see if [facility] can see her today (place stat referral and call them for appt today). If they cannot and she is still having significant pain, she will likely need to go to the ER to be seen acutely.

## 2021-01-17 NOTE — Progress Notes (Signed)
Subjective:     Patient ID: Ariel Thompson is a 30 y.o. female.   HPI   The following portions of the patient's history were reviewed and updated as appropriate.   This a new patient is here today for: office visit. The patient has been referred by Dr. Rosanna Randy for evaluation of abdominal pain and gallstones. She reports her first episode of abdominal pain started 2 days ago in the mid to right upper abdomen after eating [employer] and waffle fries.  2 hours after this meal she had severe pain followed by multiple episodes of vomiting.  Marked relief after the emesis.  No recurrent episodes of pain.  She spoke with a telehealth representative and was encouraged to go to the ED.  She went to [facility] in [place] as they have typically a far shorter ED wait, but after 5 hours had not been seen and was told it might be another 10 hours for evaluation.  She did have laboratory at that time.  She saw her PCP yesterday who arranged for an abdominal ultrasound showing cholelithiasis and evidence of gallbladder wall thickening and is seen today for further evaluation.     She is tolerated liquids well.  No further nausea or vomiting.  No fever or chills.   She was diagnosed with a UTI based on her ED of November 29.  She reported being asymptomatic.  At that time she was again 3-1/2 weeks postpartum.  She is experience no fever or chills.  She is tolerating the amoxicillin provided without difficulty.     The patient had her first normal term pregnancy 3 and half weeks ago with a healthy 8 pound child, Ariel Thompson.     The patient is accompanied by her mother, Ariel Thompson.         Chief Complaint  Patient presents with   Cholelithiasis      BP (!) 154/90   Pulse (!) 113   Temp 36.4 C (97.6 F)   Ht 165.1 cm (5' 5")   Wt 90.7 kg (200 lb)   SpO2 98%   Breastfeeding Yes   BMI 33.28 kg/m        Past Medical History:  Diagnosis Date   Depression with anxiety             Past Surgical  History:  Procedure Laterality Date   TONSILLECTOMY       tympanostomy tube placement                    OB History     Gravida  1   Para  1   Term      Preterm      AB      Living         SAB      IAB      Ectopic      Molar      Multiple      Live Births           Obstetric Comments  Age at first period 29 Age of first pregnancy 31             Social History          Socioeconomic History   Marital status: Married  Tobacco Use   Smoking status: Never   Smokeless tobacco: Never  Substance and Sexual Activity   Alcohol use: Not Currently   Drug use: Never  Allergies  Allergen Reactions   Hydrocodone Hallucination      Current Medications        Current Outpatient Medications  Medication Sig Dispense Refill   amoxicillin (AMOXIL) 500 MG tablet Take 1 tablet (500 mg total) by mouth 2 (two) times daily       docusate (COLACE) 100 MG capsule Take by mouth       DULoxetine (CYMBALTA) 20 MG DR capsule         esomeprazole (NEXIUM) 20 MG DR capsule Take by mouth       fexofenadine (ALLEGRA) 180 MG tablet Take by mouth       prenatal 73/XTGG fum/folic/dha (PRENATAL-1 ORAL) Take by mouth       clomiPHENE (CLOMID) 50 mg tablet  (Patient not taking: Reported on 01/17/2021)        No current facility-administered medications for this visit.             Family History  Problem Relation Age of Onset   Migraines Mother     Mitral valve prolapse Mother     Irritable bowel syndrome Mother     Hypothyroidism Mother     Depression Father     Migraines Sister     Asthma Sister     Allergic rhinitis Sister     Migraines Brother     Allergic rhinitis Brother     Heart valve disease Brother          heart valve abnormality   Breast cancer Maternal Aunt          great aunt x 2   Cancer Maternal Grandmother     Colon cancer Neg Hx          Labs and Radiology:    January 15, 2021 laboratory studies from [facility]:   Ref Range &  Units 2 d ago Comments    Sodium 135 - 145 mmol/L 143     Potassium 3.4 - 4.8 mmol/L 3.8     Chloride 98 - 107 mmol/L 106     CO2 20.0 - 31.0 mmol/L 27.1     Anion Gap 5 - 14 mmol/L 10     BUN 9 - 23 mg/dL 7 Low      Creatinine 0.60 - 0.80 mg/dL 0.80     BUN/Creatinine Ratio   9     eGFR CKD-EPI (2021) Female >=60 mL/min/1.41m >90  eGFR calculated with CKD-EPI 2021 equation in accordance with [employer] Ne[employer] Task Force recommendations.  Glucose 70 - 179 mg/dL 97     Calcium 8.7 - 10.4 mg/dL 9.5     Albumin 3.4 - 5.0 g/dL 4.1     Total Protein 5.7 - 8.2 g/dL 7.4     Total Bilirubin 0.3 - 1.2 mg/dL 0.6     AST <=34 U/L 186 High      ALT 10 - 49 U/L 103 High      Alkaline Phosphatase 46 - 116 U/L 305 High          Color, UA   Light Yellow   Clarity, UA   Clear   Specific Gravity, UA 1.003 - 1.030 1.008   pH, UA 5.0 - 9.0 7.0   Leukocyte Esterase, UA Negative Large Abnormal    Nitrite, UA Negative Negative   Protein, UA Negative Negative   Glucose, UA Negative Negative   Ketones, UA Negative Negative   Urobilinogen, UA <2.0 mg/dL <2.0 mg/dL   Bilirubin,  UA Negative Negative   Blood, UA Negative Small Abnormal    RBC, UA <=4 /HPF 5 High    WBC, UA 0 - 5 /HPF 27 High    Squam Epithel, UA 0 - 5 /HPF 4   Bacteria, UA None Seen /HPF Rare Abnormal    Trans Epithel, UA 0 - 2 /HPF <1       WBC 3.6 - 11.2 10*9/L 12.2 High    RBC 3.95 - 5.13 10*12/L 4.47   HGB 11.3 - 14.9 g/dL 11.3   HCT 34.0 - 44.0 % 35.0   MCV 77.6 - 95.7 fL 78.3   MCH 25.9 - 32.4 pg 25.3 Low    MCHC 32.0 - 36.0 g/dL 32.3   RDW 12.2 - 15.2 % 14.8   MPV 6.8 - 10.7 fL 7.7   Platelet 150 - 450 10*9/L 526 High    nRBC <=4 /100 WBCs 0   Neutrophils % % 78.2   Lymphocytes % % 14.8   Monocytes % % 5.6   Eosinophils % % 0.9   Basophils % % 0.5   Absolute Neutrophils 1.8 - 7.8 10*9/L 9.6 High    Absolute Lymphocytes 1.1 - 3.6 10*9/L 1.8   Absolute Monocytes 0.3 - 0.8  10*9/L 0.7   Absolute Eosinophils 0.0 - 0.5 10*9/L 0.1   Absolute Basophils 0.0 - 0.1 10*9/L 0.1       Lipase 12 - 53 U/L 44       Abdominal ultrasound of January 16, 2021 completed [facility]:   IMPRESSION: Cholelithiasis and gallbladder wall thickening, suspicious for acute cholecystitis in the appropriate clinical setting.   This study was independently reviewed.  Gallbladder wall thickening of about 4 mm.  Multiple stones.  Common bile duct less than 4 mm.   Laboratory January 17, 2021:   Component Ref Range & Units 08:30   Protein, Total 6.1 - 7.9 g/dL 6.9   Albumin 3.5 - 4.8 g/dL 4.1   Bilirubin, Total 0.3 - 1.2 mg/dL 0.7   Bilirubin, Conjugated 0.00 - 0.20 mg/dL 0.13   Alk Phos (alkaline Phosphatase) 34 - 104 U/L 403 High    AST  8 - 39 U/L 110 High    ALT  5 - 38 U/L 235 High     WBC (White Blood Cell Count) 4.1 - 10.2 10^3/uL 7.8   RBC (Red Blood Cell Count) 4.04 - 5.48 10^6/uL 4.43   Hemoglobin 12.0 - 15.0 gm/dL 11.3 Low    Hematocrit 35.0 - 47.0 % 36.1   MCV (Mean Corpuscular Volume) 80.0 - 100.0 fl 81.5   MCH (Mean Corpuscular Hemoglobin) 27.0 - 31.2 pg 25.5 Low    MCHC (Mean Corpuscular Hemoglobin Concentration) 32.0 - 36.0 gm/dL 31.3 Low    Platelet Count 150 - 450 10^3/uL 481 High    RDW-CV (Red Cell Distribution Width) 11.6 - 14.8 % 14.0   MPV (Mean Platelet Volume) 9.4 - 12.4 fl 9.5   Neutrophils 1.50 - 7.80 10^3/uL 4.97   Lymphocytes 1.00 - 3.60 10^3/uL 2.01   Monocytes 0.00 - 1.50 10^3/uL 0.39   Eosinophils 0.00 - 0.55 10^3/uL 0.35   Basophils 0.00 - 0.09 10^3/uL 0.04   Neutrophil % 32.0 - 70.0 % 64.0   Lymphocyte % 10.0 - 50.0 % 25.9   Monocyte % 4.0 - 13.0 % 5.0   Eosinophil % 1.0 - 5.0 % 4.5   Basophil% 0.0 - 2.0 % 0.5   Immature Granulocyte % <=0.7 % 0.1   Immature  Granulocyte Count <=0.06 10^3/L 0.01       Mild elevation of the alkaline phosphatase over the last 48 hours, stable or normal transaminase levels.  No elevation of serum bilirubin.   White blood cell count elevation has resolved with normal distribution.     Review of Systems  Constitutional: Negative for chills and fever.  HENT: Negative.   Eyes: Negative.   Respiratory: Negative.  Negative for cough.   Cardiovascular: Negative.   Gastrointestinal: Positive for abdominal pain.  Endocrine: Negative.   Genitourinary: Negative.   Musculoskeletal: Negative.   Skin: Negative.   Hematological: Negative.   Psychiatric/Behavioral: Negative.          Objective:   Physical Exam Exam conducted with a chaperone present.  Constitutional:      Appearance: Normal appearance.  Cardiovascular:     Rate and Rhythm: Normal rate and regular rhythm.     Pulses: Normal pulses.     Heart sounds: Normal heart sounds.  Pulmonary:     Effort: Pulmonary effort is normal.     Breath sounds: Normal breath sounds.  Abdominal:     Tenderness: There is abdominal tenderness in the right upper quadrant and epigastric area. There is no guarding or rebound.    Musculoskeletal:     Cervical back: Neck supple.  Skin:    General: Skin is warm and dry.  Neurological:     Mental Status: She is alert and oriented to person, place, and time.  Psychiatric:        Mood and Affect: Mood normal.        Behavior: Behavior normal.           Assessment:     Clinical history compatible with biliary colic.   Modest elevation of serum transaminases and alkaline phosphatase without elevated bilirubin.  No risk factors for hepatitis.   Asymptomatic UTI, possibly related to recent delivery rather than active infection.    Plan:     The patient has had the 1 episode of pain 48 hours ago and since then has been asymptomatic on a very low-fat diet.  With her multiple stones and gallbladder wall thickening it seems prudent to proceed with cholecystectomy.  The risks of surgery were reviewed in detail with both the patient and her mother.  Plans are for a laparoscopic procedure with open surgery  if indicated.   She is aware and has a large quantity of milk stored in the freezer.  (She had to stop nursing and replaced this was pumping and bottlefeeding due to the volume milk she was producing) and is aware that she will "pump and dump" for 24 hours after anesthesia.   Surgery is tentatively scheduled for tomorrow morning.    This note is partially prepared by Ledell Noss, CMA acting as a scribe in the presence of Dr. Hervey Ard, MD.    The documentation recorded by the scribe accurately reflects the service I personally performed and the decisions made by me.    Robert Bellow, MD FACS

## 2021-01-17 NOTE — Telephone Encounter (Signed)
See other telephone encounter.

## 2021-01-17 NOTE — Telephone Encounter (Signed)
Patient has appointment for surgery tomorrow with Dr. Bary Castilla

## 2021-01-17 NOTE — Telephone Encounter (Signed)
Patient has surgery with Dr. Bary Castilla scheduled for tomorrow. I called patient and she is aware of this.

## 2021-01-18 ENCOUNTER — Ambulatory Visit
Admission: RE | Admit: 2021-01-18 | Discharge: 2021-01-18 | Disposition: A | Discharge status: Home / Self Care | Payer: BC Managed Care – PPO | Attending: General Surgery | Admitting: General Surgery

## 2021-01-18 ENCOUNTER — Ambulatory Visit: Payer: BC Managed Care – PPO | Admitting: Certified Registered Nurse Anesthetist

## 2021-01-18 ENCOUNTER — Encounter
Admission: RE | Disposition: A | Discharge status: Home / Self Care | Payer: Self-pay | Source: Home / Self Care | Attending: General Surgery

## 2021-01-18 ENCOUNTER — Other Ambulatory Visit: Payer: Self-pay

## 2021-01-18 ENCOUNTER — Ambulatory Visit: Payer: BC Managed Care – PPO

## 2021-01-18 ENCOUNTER — Encounter: Payer: Self-pay | Admitting: General Surgery

## 2021-01-18 DIAGNOSIS — O99345 Other mental disorders complicating the puerperium: Secondary | ICD-10-CM | POA: Diagnosis not present

## 2021-01-18 DIAGNOSIS — F32A Depression, unspecified: Secondary | ICD-10-CM | POA: Diagnosis not present

## 2021-01-18 DIAGNOSIS — K8010 Calculus of gallbladder with chronic cholecystitis without obstruction: Secondary | ICD-10-CM | POA: Diagnosis not present

## 2021-01-18 DIAGNOSIS — Z419 Encounter for procedure for purposes other than remedying health state, unspecified: Secondary | ICD-10-CM

## 2021-01-18 DIAGNOSIS — O9963 Diseases of the digestive system complicating the puerperium: Secondary | ICD-10-CM | POA: Diagnosis not present

## 2021-01-18 DIAGNOSIS — F419 Anxiety disorder, unspecified: Secondary | ICD-10-CM | POA: Insufficient documentation

## 2021-01-18 HISTORY — PX: CHOLECYSTECTOMY: SHX55

## 2021-01-18 LAB — POCT PREGNANCY, URINE: Preg Test, Ur: NEGATIVE

## 2021-01-18 SURGERY — LAPAROSCOPIC CHOLECYSTECTOMY WITH INTRAOPERATIVE CHOLANGIOGRAM
Anesthesia: General | Site: Abdomen

## 2021-01-18 MED ORDER — 0.9 % SODIUM CHLORIDE (POUR BTL) OPTIME
TOPICAL | Status: DC | PRN
  Administered 2021-01-18: 700 mL

## 2021-01-18 MED ORDER — PROPOFOL 10 MG/ML IV BOLUS
INTRAVENOUS | Status: AC
  Filled 2021-01-18: qty 20

## 2021-01-18 MED ORDER — CHLORHEXIDINE GLUCONATE 0.12 % MT SOLN
15.0000 mL | Freq: Once | OROMUCOSAL | Status: AC
  Administered 2021-01-18: 15 mL via OROMUCOSAL

## 2021-01-18 MED ORDER — TRAMADOL HCL 50 MG PO TABS: 50.0000 mg | ORAL_TABLET | ORAL | 0 refills | Status: DC | PRN

## 2021-01-18 MED ORDER — ACETAMINOPHEN 10 MG/ML IV SOLN
INTRAVENOUS | Status: AC
  Filled 2021-01-18: qty 100

## 2021-01-18 MED ORDER — ONDANSETRON HCL 4 MG/2ML IJ SOLN
INTRAMUSCULAR | Status: DC | PRN
  Administered 2021-01-18: 4 mg via INTRAVENOUS

## 2021-01-18 MED ORDER — SUGAMMADEX SODIUM 200 MG/2ML IV SOLN
INTRAVENOUS | Status: DC | PRN
  Administered 2021-01-18: 200 mg via INTRAVENOUS

## 2021-01-18 MED ORDER — MIDAZOLAM HCL 2 MG/2ML IJ SOLN
INTRAMUSCULAR | Status: AC
  Filled 2021-01-18: qty 2

## 2021-01-18 MED ORDER — CEFAZOLIN SODIUM-DEXTROSE 2-4 GM/100ML-% IV SOLN
INTRAVENOUS | Status: AC
  Filled 2021-01-18: qty 100

## 2021-01-18 MED ORDER — CEFAZOLIN SODIUM-DEXTROSE 2-4 GM/100ML-% IV SOLN
2.0000 g | INTRAVENOUS | Status: AC
  Administered 2021-01-18: 2 g via INTRAVENOUS

## 2021-01-18 MED ORDER — FENTANYL CITRATE (PF) 100 MCG/2ML IJ SOLN
INTRAMUSCULAR | Status: DC | PRN
  Administered 2021-01-18 (×3): 50 ug via INTRAVENOUS

## 2021-01-18 MED ORDER — LACTATED RINGERS IV SOLN: INTRAVENOUS | Status: DC

## 2021-01-18 MED ORDER — DEXAMETHASONE SODIUM PHOSPHATE 10 MG/ML IJ SOLN
INTRAMUSCULAR | Status: DC | PRN
Start: 2021-01-18 — End: 2021-01-18
  Administered 2021-01-18: 10 mg via INTRAVENOUS

## 2021-01-18 MED ORDER — SODIUM CHLORIDE 0.9 % IV SOLN
INTRAVENOUS | Status: DC | PRN
  Administered 2021-01-18: 18 mL

## 2021-01-18 MED ORDER — ORAL CARE MOUTH RINSE: 15.0000 mL | Freq: Once | OROMUCOSAL | Status: AC

## 2021-01-18 MED ORDER — CHLORHEXIDINE GLUCONATE 0.12 % MT SOLN
OROMUCOSAL | Status: AC
  Filled 2021-01-18: qty 15

## 2021-01-18 MED ORDER — FENTANYL CITRATE (PF) 100 MCG/2ML IJ SOLN
INTRAMUSCULAR | Status: AC
  Filled 2021-01-18: qty 2

## 2021-01-18 MED ORDER — CHLORHEXIDINE GLUCONATE CLOTH 2 % EX PADS: 6.0000 | MEDICATED_PAD | Freq: Once | CUTANEOUS | Status: DC

## 2021-01-18 MED ORDER — PROPOFOL 10 MG/ML IV BOLUS
INTRAVENOUS | Status: DC | PRN
  Administered 2021-01-18: 200 mg via INTRAVENOUS

## 2021-01-18 MED ORDER — ROCURONIUM BROMIDE 100 MG/10ML IV SOLN
INTRAVENOUS | Status: DC | PRN
  Administered 2021-01-18: 50 mg via INTRAVENOUS

## 2021-01-18 MED ORDER — MIDAZOLAM HCL 2 MG/2ML IJ SOLN
INTRAMUSCULAR | Status: DC | PRN
  Administered 2021-01-18: 2 mg via INTRAVENOUS

## 2021-01-18 MED ORDER — KETOROLAC TROMETHAMINE 30 MG/ML IJ SOLN
INTRAMUSCULAR | Status: DC | PRN
Start: 2021-01-18 — End: 2021-01-18
  Administered 2021-01-18: 30 mg via INTRAVENOUS

## 2021-01-18 MED ORDER — LIDOCAINE HCL (CARDIAC) PF 100 MG/5ML IV SOSY
PREFILLED_SYRINGE | INTRAVENOUS | Status: DC | PRN
  Administered 2021-01-18: 100 mg via INTRAVENOUS

## 2021-01-18 MED ORDER — FENTANYL CITRATE (PF) 100 MCG/2ML IJ SOLN
25.0000 ug | INTRAMUSCULAR | Status: DC | PRN
  Administered 2021-01-18: 25 ug via INTRAVENOUS

## 2021-01-18 MED ORDER — SODIUM CHLORIDE (PF) 0.9 % IJ SOLN
INTRAMUSCULAR | Status: AC
  Filled 2021-01-18: qty 50

## 2021-01-18 SURGICAL SUPPLY — 40 items
APL PRP STRL LF DISP 70% ISPRP (MISCELLANEOUS) ×1
APPLIER CLIP ROT 10 11.4 M/L (STAPLE) ×2
APR CLP MED LRG 11.4X10 (STAPLE) ×1
BAG SPEC RTRVL LRG 6X4 10 (ENDOMECHANICALS)
BLADE SURG 11 STRL SS SAFETY (MISCELLANEOUS) ×2 IMPLANT
CANNULA DILATOR 10 W/SLV (CANNULA) ×2 IMPLANT
CANNULA DILATOR 5 W/SLV (CANNULA) ×4 IMPLANT
CATH CHOLANG 76X19 KUMAR (CATHETERS) ×2 IMPLANT
CHLORAPREP W/TINT 26 (MISCELLANEOUS) ×2 IMPLANT
CLIP APPLIE ROT 10 11.4 M/L (STAPLE) ×1 IMPLANT
DRAPE C-ARM 42X70 (DRAPES) ×2 IMPLANT
DRSG TEGADERM 2-3/8X2-3/4 SM (GAUZE/BANDAGES/DRESSINGS) ×8 IMPLANT
DRSG TELFA 4X3 1S NADH ST (GAUZE/BANDAGES/DRESSINGS) ×2 IMPLANT
ELECT REM PT RETURN 9FT ADLT (ELECTROSURGICAL) ×2
ELECTRODE REM PT RTRN 9FT ADLT (ELECTROSURGICAL) ×1 IMPLANT
GAUZE 4X4 16PLY ~~LOC~~+RFID DBL (SPONGE) ×2 IMPLANT
GLOVE SURG ENC MOIS LTX SZ7.5 (GLOVE) ×2 IMPLANT
GLOVE SURG UNDER LTX SZ8 (GLOVE) ×2 IMPLANT
GOWN STRL REUS W/ TWL LRG LVL3 (GOWN DISPOSABLE) ×3 IMPLANT
GOWN STRL REUS W/TWL LRG LVL3 (GOWN DISPOSABLE) ×6
IRRIGATION STRYKERFLOW (MISCELLANEOUS) ×1 IMPLANT
IRRIGATOR STRYKERFLOW (MISCELLANEOUS) ×2
IV LACTATED RINGERS 1000ML (IV SOLUTION) ×2 IMPLANT
KIT TURNOVER KIT A (KITS) ×2 IMPLANT
LABEL OR SOLS (LABEL) ×2 IMPLANT
MANIFOLD NEPTUNE II (INSTRUMENTS) ×2 IMPLANT
NDL INSUFF ACCESS 14 VERSASTEP (NEEDLE) ×2 IMPLANT
NS IRRIG 500ML POUR BTL (IV SOLUTION) ×2 IMPLANT
PACK LAP CHOLECYSTECTOMY (MISCELLANEOUS) ×2 IMPLANT
POUCH SPECIMEN RETRIEVAL 10MM (ENDOMECHANICALS) IMPLANT
SCISSORS METZENBAUM CVD 33 (INSTRUMENTS) ×2 IMPLANT
SET TUBE SMOKE EVAC HIGH FLOW (TUBING) ×2 IMPLANT
SPONGE KITTNER 5P (MISCELLANEOUS) IMPLANT
STRIP CLOSURE SKIN 1/2X4 (GAUZE/BANDAGES/DRESSINGS) ×2 IMPLANT
SUT VIC AB 0 CT2 27 (SUTURE) IMPLANT
SUT VIC AB 4-0 FS2 27 (SUTURE) ×2 IMPLANT
SWABSTK COMLB BENZOIN TINCTURE (MISCELLANEOUS) ×2 IMPLANT
TROCAR XCEL NON-BLD 11X100MML (ENDOMECHANICALS) ×2 IMPLANT
WATER STERILE IRR 1000ML POUR (IV SOLUTION) ×2 IMPLANT
WATER STERILE IRR 500ML POUR (IV SOLUTION) ×2 IMPLANT

## 2021-01-18 NOTE — Anesthesia Procedure Notes (Signed)
Procedure Name: Intubation Date/Time: 01/18/2021 9:36 AM Performed by: Lesle Reek, CRNA Pre-anesthesia Checklist: Patient identified, Patient being monitored, Timeout performed, Emergency Drugs available and Suction available Patient Re-evaluated:Patient Re-evaluated prior to induction Oxygen Delivery Method: Circle system utilized Preoxygenation: Pre-oxygenation with 100% oxygen Induction Type: IV induction Ventilation: Mask ventilation without difficulty Laryngoscope Size: Mac and 3 Grade View: Grade I Tube type: Oral Tube size: 7.0 mm Number of attempts: 1 Airway Equipment and Method: Stylet Placement Confirmation: ETT inserted through vocal cords under direct vision, positive ETCO2 and breath sounds checked- equal and bilateral Secured at: 21 cm Tube secured with: Tape Dental Injury: Teeth and Oropharynx as per pre-operative assessment

## 2021-01-18 NOTE — Discharge Instructions (Signed)

## 2021-01-18 NOTE — H&P (Signed)
Ariel Thompson [id number] Jul 07, 1990     HPI: [name] 3+ weeks post partum with recent episode of biliary colic. Normal bilirubin but modest elevation of alk phos and transaminases.  U/S showed gallbladder wall thickening and a normal CBD. Asymptomatic since initial episode three days ago. For cholecystectomy.   Medications Prior to Admission  Medication Sig Dispense Refill Last Dose   amoxicillin (AMOXIL) 500 MG tablet Take 1 tablet (500 mg total) by mouth 2 (two) times daily. 12 tablet 0 01/18/2021 at 0600   docusate sodium (COLACE) 100 MG capsule Take 100 mg by mouth at bedtime.   01/17/2021   DULoxetine (CYMBALTA) 20 MG capsule Take 1 capsule (20 mg total) by mouth daily. (Patient taking differently: Take 20 mg by mouth at bedtime.) 90 capsule 0 01/17/2021   esomeprazole (NEXIUM) 20 MG capsule Take 20 mg by mouth at bedtime.   01/17/2021   fexofenadine (ALLEGRA) 180 MG tablet Take 180 mg by mouth at bedtime.   01/17/2021   ibuprofen (ADVIL) 600 MG tablet Take 1 tablet (600 mg total) by mouth every 6 (six) hours. (Patient taking differently: Take 600 mg by mouth every 6 (six) hours as needed for mild pain or moderate pain.) 30 tablet 0 01/17/2021   Prenatal MV & Min w/FA-DHA (PRENATAL ADULT GUMMY/DHA/FA PO) Take 1 tablet by mouth at bedtime.   01/17/2021   Doxylamine-Pyridoxine (DICLEGIS) 10-10 MG TBEC Take 2 tablets by mouth at bedtime. If symptoms persist, add one tablet in the morning and one in the afternoon (Patient not taking: Reported on 01/16/2021) 100 tablet 5 Not Taking   Allergies  Allergen Reactions   Hydrocodone-Acetaminophen     tachycardia, anxiety, hallucinations.   Past Medical History:  Diagnosis Date   Depression with anxiety    Past Surgical History:  Procedure Laterality Date   TONSILLECTOMY  2010   TYMPANOSTOMY TUBE PLACEMENT     Social History   Socioeconomic History   Marital status: Married    Spouse name: Not on file   Number of children: Not  on file   Years of education: Not on file   Highest education level: Not on file  Occupational History   Not on file  Tobacco Use   Smoking status: Never   Smokeless tobacco: Never  Vaping Use   Vaping Use: Never used  Substance and Sexual Activity   Alcohol use: Not Currently    Comment: occasional   Drug use: No   Sexual activity: Yes    Birth control/protection: Other-see comments    Comment: Undecided  Other Topics Concern   Not on file  Social History Narrative   Not on file   Social Determinants of Health   Financial Resource Strain: Not on file  Food Insecurity: Not on file  Transportation Needs: Not on file  Physical Activity: Not on file  Stress: Not on file  Social Connections: Not on file  Intimate Partner Violence: Not on file   Social History   Social History Narrative   Not on file     ROS: Negative.     PE: HEENT: Negative. Lungs: Clear. Cardio: RR.  Assessment/Plan:  Proceed with planned cholecystectomy.   Forest Gleason [facility] 01/18/2021

## 2021-01-18 NOTE — Op Note (Signed)
Preoperative diagnosis: Subacute cholecystitis and cholelithiasis.  Postoperative diagnosis: Same.  Operative procedure: Laparoscopic cholecystectomy with intraoperative cholangiograms.  Operating Surgeon: Hervey Ard, MD.  Anesthesia: General endotracheal.  Estimated blood loss: Less than 5 cc.  Clinical note: This 30 year old woman was well until 3 days ago when she had a severe episode of upper abdominal pain.  A.  Ultrasound showed evidence of gallbladder wall edema and sludge and small stones.  Mild elevation of serum transaminases.  She is felt to be a candidate for early cholecystectomy.  SCD stockings in place for DVT prevention.  She received Ancef on induction of anesthesia.  Operative note: With the patient under adequate general endotracheal anesthesia the abdomen was cleansed with ChloraPrep and draped.  In Trendelenburg position a varies needle was placed returns umbilical incision.  After assuring intra-abdominal location with a hanging drop test the abdomen was insufflated with CO2 at 10 mmHg pressure.  The patient was placed in reverse Trendelenburg position and rolled to the left.  An 11 mm XL port was placed in the epigastrium under direct vision and 2-5 mm Ports were placed on the right lateral abdominal wall.  There is mild irritation of the gallbladder without pronounced edema.  The gallbladder is placed on cephalad traction.  The neck of the gallbladder was cleared and cystic artery and cystic duct were identified.  A Kumar clamp was placed and fluoroscopic cholangiogram was obtained using 18 cc of 1 half-strength Hypaque.  There was a significant amount of reflux into the gallbladder but there was a small trickle of contrast into the cystic duct and into the common bile duct into the duodenum.  No opacification of the proximal duct.  Photograph was obtained of the critical view of safety.  The gallbladder was taken down from the liver bed making use of hook cautery  dissection.  Placed in an Endo Catch bag and delivered through the umbilical port site.  It was opened and the sludge and small stones about 3 mm in diameter were extracted.  The abdomen was reinsufflated and the right upper quadrant irrigated with lactated Ringer solution.  Good hemostasis was noted.  The abdomen was then desufflated and ports removed under direct vision.  The umbilical defect was closed with a 0 Vicryl figure-of-eight suture.  Skin incisions were closed with 4-0 Vicryl subcuticular sutures.  Benzoin, Steri-Strips, Telfa and Tegaderm dressings applied.  Patient tolerated procedure well and was taken to recovery in stable condition.

## 2021-01-18 NOTE — Transfer of Care (Signed)
Immediate Anesthesia Transfer of Care Note  Patient: Ariel Thompson  Procedure(s) Performed: LAPAROSCOPIC CHOLECYSTECTOMY WITH INTRAOPERATIVE CHOLANGIOGRAM (Abdomen)  Patient Location: PACU  Anesthesia Type:General  Level of Consciousness: awake, alert  and oriented  Airway & Oxygen Therapy: Patient Spontanous Breathing  Post-op Assessment: Report given to RN  Post vital signs: Reviewed and stable  Last Vitals:  Vitals Value Taken Time  BP 131/83 01/18/21 1026  Temp    Pulse 85 01/18/21 1028  Resp 14 01/18/21 1028  SpO2 97 % 01/18/21 1028  Vitals shown include unvalidated device data.  Last Pain:  Vitals:   01/18/21 0750  PainSc: 0-No pain         Complications: No notable events documented.

## 2021-01-18 NOTE — Anesthesia Preprocedure Evaluation (Signed)
Anesthesia Evaluation  Patient identified by MRN, date of birth, ID band Patient awake    Reviewed: Allergy & Precautions, NPO status , Patient's Chart, lab work & pertinent test results  History of Anesthesia Complications Negative for: history of anesthetic complications  Airway Mallampati: III  TM Distance: >3 FB Neck ROM: full    Dental  (+) Chipped   Pulmonary neg pulmonary ROS, neg shortness of breath,    Pulmonary exam normal        Cardiovascular Exercise Tolerance: Good (-) angina(-) Past MI and (-) DOE negative cardio ROS Normal cardiovascular exam     Neuro/Psych  Headaches, PSYCHIATRIC DISORDERS    GI/Hepatic negative GI ROS, Neg liver ROS,   Endo/Other  negative endocrine ROS  Renal/GU      Musculoskeletal   Abdominal   Peds  Hematology negative hematology ROS (+)   Anesthesia Other Findings Past Medical History: No date: Depression with anxiety  Past Surgical History: 2010: TONSILLECTOMY No date: TYMPANOSTOMY TUBE PLACEMENT  BMI    Body Mass Index: 33.28 kg/m      Reproductive/Obstetrics negative OB ROS                             Anesthesia Physical Anesthesia Plan  ASA: 2  Anesthesia Plan: General ETT   Post-op Pain Management:    Induction: Intravenous  PONV Risk Score and Plan: Ondansetron, Dexamethasone, Midazolam and Treatment may vary due to age or medical condition  Airway Management Planned: Oral ETT  Additional Equipment:   Intra-op Plan:   Post-operative Plan: Extubation in OR  Informed Consent: I have reviewed the patients History and Physical, chart, labs and discussed the procedure including the risks, benefits and alternatives for the proposed anesthesia with the patient or authorized representative who has indicated his/her understanding and acceptance.     Dental Advisory Given  Plan Discussed with: Anesthesiologist, CRNA and  Surgeon  Anesthesia Plan Comments: (Patient consented for risks of anesthesia including but not limited to:  - adverse reactions to medications - damage to eyes, teeth, lips or other oral mucosa - nerve damage due to positioning  - sore throat or hoarseness - Damage to heart, brain, nerves, lungs, other parts of body or loss of life  Patient voiced understanding.)        Anesthesia Quick Evaluation

## 2021-01-21 LAB — SURGICAL PATHOLOGY

## 2021-01-23 NOTE — Anesthesia Postprocedure Evaluation (Signed)
Anesthesia Post Note  Patient: Ariel Thompson  Procedure(s) Performed: LAPAROSCOPIC CHOLECYSTECTOMY WITH INTRAOPERATIVE CHOLANGIOGRAM (Abdomen)  Patient location during evaluation: PACU Anesthesia Type: General Level of consciousness: awake and alert Pain management: pain level controlled Vital Signs Assessment: post-procedure vital signs reviewed and stable Respiratory status: spontaneous breathing, nonlabored ventilation, respiratory function stable and patient connected to nasal cannula oxygen Cardiovascular status: blood pressure returned to baseline and stable Postop Assessment: no apparent nausea or vomiting Anesthetic complications: no   No notable events documented.   Last Vitals:  Vitals:   01/18/21 1050 01/18/21 1100  BP: 129/77 (!) 134/91  Pulse: 81 78  Resp: 20 16  Temp: 36.9 C 36.8 C  SpO2: 100% 96%    Last Pain:  Vitals:   01/18/21 1100  TempSrc: Temporal  PainSc: [place]

## 2021-01-24 ENCOUNTER — Encounter: Payer: Self-pay | Admitting: General Surgery

## 2021-01-25 ENCOUNTER — Telehealth: Payer: Self-pay

## 2021-01-25 NOTE — Telephone Encounter (Signed)
Pt calling; is about 96m pp; had gallbladder removed last week as well as a uti; is starting to develop a yeast inf; can she do monistat 3 or 7d?  [phone]  Pt she can.

## 2021-01-30 ENCOUNTER — Telehealth: Payer: Self-pay

## 2021-01-30 NOTE — Telephone Encounter (Signed)
Patient is scheduled for 6 week PP and mirena 12/29/22with RPH

## 2021-01-31 NOTE — Telephone Encounter (Signed)
Noted. Will order to arrive by apt date/time. 

## 2021-02-14 ENCOUNTER — Encounter: Payer: Self-pay | Admitting: Obstetrics & Gynecology

## 2021-02-14 ENCOUNTER — Other Ambulatory Visit (HOSPITAL_COMMUNITY)
Admission: RE | Admit: 2021-02-14 | Discharge: 2021-02-14 | Disposition: A | Discharge status: Home / Self Care | Payer: BC Managed Care – PPO | Source: Ambulatory Visit | Attending: Obstetrics & Gynecology | Admitting: Obstetrics & Gynecology

## 2021-02-14 ENCOUNTER — Ambulatory Visit (INDEPENDENT_AMBULATORY_CARE_PROVIDER_SITE_OTHER): Payer: BC Managed Care – PPO | Admitting: Obstetrics & Gynecology

## 2021-02-14 ENCOUNTER — Other Ambulatory Visit: Payer: Self-pay

## 2021-02-14 DIAGNOSIS — Z124 Encounter for screening for malignant neoplasm of cervix: Secondary | ICD-10-CM | POA: Insufficient documentation

## 2021-02-14 DIAGNOSIS — Z30430 Encounter for insertion of intrauterine contraceptive device: Secondary | ICD-10-CM | POA: Diagnosis not present

## 2021-02-14 NOTE — Patient Instructions (Signed)
Intrauterine Device Insertion, Care After This sheet gives you information about how to care for yourself after your procedure. Your health care provider may also give you more specific instructions. If you have problems or questions, contact your health care provider. What can I expect after the procedure? After the procedure, it is common to have: Cramps and pain in the abdomen. Bleeding. It may be light or heavy. This may last for a few days. Lower back pain. Dizziness. Headaches. Nausea. Follow these instructions at home:  Before resuming sexual activity, check to make sure that you can feel the IUD string or strings. You should be able to feel the end of the string below the opening of your cervix. If your IUD string is in place, you may resume sexual activity. If you had a hormonal IUD inserted more than 7 days after your most recent period started, you will need to use a backup method of birth control for 7 days after IUD insertion. Ask your health care provider whether this applies to you. Continue to check that the IUD is still in place by feeling for the strings after every menstrual period, or once a month. An IUD will not protect you from sexually transmitted infections (STIs). Use methods to prevent the exchange of body fluids between partners (barrier protection) every time you have sex. Barrier protection can be used during oral, vaginal, or anal sex. Commonly used barrier methods include: Female condom. Female condom. Dental dam. Take over-the-counter and prescription medicines only as told by your health care provider. Keep all follow-up visits as told by your health care provider. This is important. Contact a health care provider if: You feel light-headed or weak. You have any of the following problems with your IUD string or strings: The string bothers or hurts you or your sexual partner. You cannot feel the string. The string has gotten longer. You can feel the IUD in  your vagina. You think you may be pregnant, or you miss your menstrual period. You think you may have a sexually transmitted infection (STI). Get help right away if: You have flu-like symptoms, such as tiredness (fatigue) and muscle aches. You have a fever and chills. You have bleeding that is heavier or lasts longer than a normal menstrual cycle. You have abnormal or bad-smelling discharge from your vagina. You develop abdominal pain that is new, is getting worse, or is not in the same area of earlier cramping and pain. You have pain during sexual activity. Summary After the procedure, it is common to have cramps and pain in the abdomen. It is also common to have light bleeding or heavier bleeding that is like your menstrual period. Continue to check that the IUD is still in place by feeling for the strings after every menstrual period, or once a month. Keep all follow-up visits as told by your health care provider. This is important. Contact your health care provider if you have problems with your IUD strings, such as the string getting longer or bothering you or your sexual partner. This information is not intended to replace advice given to you by your health care provider. Make sure you discuss any questions you have with your health care provider. Document Revised: 01/25/2019 Document Reviewed: 01/25/2019 Elsevier Patient Education  2022 Elsevier Inc.  

## 2021-02-14 NOTE — Progress Notes (Signed)
°  OBSTETRICS POSTPARTUM CLINIC PROGRESS NOTE  Subjective:     Ariel Thompson is a 30 y.o. G31P0000 female who presents for a postpartum visit. She is 6 weeks postpartum following a Term pregnancy, Single fetus, or Uncomplicated pregnancy and delivery by Vaginal, no problems at delivery.  I have fully reviewed the prenatal and intrapartum course. Anesthesia: epidural.  Postpartum course has been complicated by uncomplicated.  Baby is feeding by Breast and in a bottle .  Bleeding: patient has not  resumed menses.  Bowel function is normal. Bladder function is normal.  Patient is not sexually active. Contraception method desired is IUD.  Postpartum depression screening: negative. [place] 2.  The following portions of the patient's history were reviewed and updated as appropriate: allergies, current medications, past family history, past medical history, past social history, past surgical history, and problem list.  Review of Systems Pertinent items are noted in HPI.  Objective:    BP 128/80    Ht 5\' 5"  (1.651 m)    Wt 207 lb (93.9 kg)    Breastfeeding Yes    BMI 34.45 kg/m   General:  alert and no distress   Breasts:  inspection negative, no nipple discharge or bleeding, no masses or nodularity palpable  Lungs: clear to auscultation bilaterally  Heart:  regular rate and rhythm, S1, S2 normal, no murmur, click, rub or gallop  Abdomen: soft, non-tender; bowel sounds normal; no masses,  no organomegaly.     Vulva:  normal  Vagina: normal vagina, no discharge, exudate, lesion, or erythema  Cervix:  no cervical motion tenderness and no lesions  Corpus: normal size, contour, position, consistency, mobility, non-tender  Adnexa:  normal adnexa and no mass, fullness, tenderness  Rectal Exam: Not performed.          Assessment:  Post Partum Care visit 1. Postpartum care following vaginal delivery  2. Screening for cervical cancer - Cytology - PAP  3. Encounter for insertion of  intrauterine contraceptive device (IUD) See below   Plan:  See orders and Patient Instructions Resume all normal activities Follow up in: 4 weeks or as needed.     IUD PROCEDURE NOTE:  Ariel Thompson is a 30 y.o. G1P0000 here for IUD insertion. No GYN concerns.  Last pap smear was normal.  IUD Insertion Procedure Note Patient identified, informed consent performed, consent signed.   Discussed risks of irregular bleeding, cramping, infection, malpositioning or misplacement of the IUD outside the uterus which may require further procedure such as laparoscopy, risk of failure <1%. Time out was performed.  Urine pregnancy test negative.  A bimanual exam showed the uterus to be midposition.  Speculum placed in the vagina.  Cervix visualized.  Cleaned with Betadine x 2.  Grasped anteriorly with a single tooth tenaculum.  Uterus sounded to 7 cm.   Mirena IUD placed per manufacturer's recommendations.  Strings trimmed to 3 cm. Tenaculum was removed, good hemostasis noted.  Patient tolerated procedure well.   Patient was given post-procedure instructions.  She was advised to have backup contraception for one week.  Patient was also asked to check IUD strings periodically and follow up in 4 weeks for IUD check.  Barnett Applebaum, MD, [facility], [facility] 02/14/2021  3:45 PM

## 2021-02-19 LAB — CYTOLOGY - PAP: Diagnosis: NEGATIVE

## 2021-02-25 NOTE — Telephone Encounter (Signed)
Mirena rcvd/charged 02/14/2021

## 2021-03-21 ENCOUNTER — Other Ambulatory Visit: Payer: Self-pay

## 2021-03-21 ENCOUNTER — Encounter: Payer: Self-pay | Admitting: Obstetrics & Gynecology

## 2021-03-21 ENCOUNTER — Ambulatory Visit: Payer: BC Managed Care – PPO | Admitting: Obstetrics & Gynecology

## 2021-03-21 VITALS — BP 112/78 | Ht 65.0 in | Wt 209.0 lb

## 2021-03-21 DIAGNOSIS — Z30431 Encounter for routine checking of intrauterine contraceptive device: Secondary | ICD-10-CM

## 2021-03-21 NOTE — Progress Notes (Signed)
°  History of Present Illness:  Ariel Thompson is a 31 y.o. that had a Mirena IUD placed approximately 4 weeks ago. Since that time, she states that she has had no bleeding and pain  PMHx: She  has a past medical history of Depression with anxiety. Also,  has a past surgical history that includes Tonsillectomy (2010); Tympanostomy tube placement; and Cholecystectomy (N/A, 01/18/2021)., family history includes Allergic rhinitis in her brother and sister; Asthma in her sister; Cancer in her maternal grandmother; Depression in her father; Heart Problems in her brother; Hypothyroidism in her mother; Migraines in her brother, mother, and sister; Mitral valve prolapse in her mother; Other in her mother.,  reports that she has never smoked. She has never used smokeless tobacco. She reports that she does not currently use alcohol. She reports that she does not use drugs. Current Meds  Medication Sig   DULoxetine (CYMBALTA) 20 MG capsule Take 1 capsule (20 mg total) by mouth daily.   esomeprazole (NEXIUM) 20 MG capsule Take 20 mg by mouth at bedtime.   fexofenadine (ALLEGRA) 180 MG tablet Take 180 mg by mouth at bedtime.   ibuprofen (ADVIL) 600 MG tablet Take 1 tablet (600 mg total) by mouth every 6 (six) hours. (Patient taking differently: Take 600 mg by mouth every 6 (six) hours as needed for mild pain or moderate pain.)   levonorgestrel (MIRENA) 20 MCG/DAY IUD 1 each by Intrauterine route once.   Prenatal MV & Min w/FA-DHA (PRENATAL ADULT GUMMY/DHA/FA PO) Take 1 tablet by mouth at bedtime.  .  Also, is allergic to hydrocodone-acetaminophen..  Review of Systems  All other systems reviewed and are negative.  Physical Exam:  BP 112/78    Ht 5\' 5"  (1.651 m)    Wt 209 lb (94.8 kg)    Breastfeeding Yes    BMI 34.78 kg/m  Body mass index is 34.78 kg/m. Constitutional: Well nourished, well developed female in no acute distress.  Abdomen: diffusely non tender to palpation, non distended, and no masses,  hernias Neuro: Grossly intact Psych:  Normal mood and affect.    Pelvic exam:  Two IUD strings present seen coming from the cervical os. EGBUS, vaginal vault and cervix: within normal limits  Assessment: IUD strings present in proper location; pt doing well  Plan: She was told to continue to use barrier contraception, in order to prevent any STIs, and to take a home pregnancy test or call us if she ever thinks she may be pregnant, and that her IUD expires in 8 years.  She was amenable to this plan and we will see her back in 1 year/PRN.  A total of 20 minutes were spent face-to-face with the patient as well as preparation, review, communication, and documentation during this encounter.   Barnett Applebaum, MD, [facility], [facility] 03/21/2021  4:31 PM

## 2021-04-13 ENCOUNTER — Other Ambulatory Visit: Payer: Self-pay | Admitting: Family Medicine

## 2021-04-15 NOTE — Telephone Encounter (Signed)
Requested Prescriptions  °Pending Prescriptions Disp Refills  °• DULoxetine (CYMBALTA) 20 MG capsule [Pharmacy Med Name: DULOXETINE HCL DR 20 MG CAP] 90 capsule 0  °  Sig: TAKE 1 CAPSULE BY MOUTH EVERY DAY  °  ° Psychiatry: Antidepressants - SNRI - duloxetine Failed - 04/13/2021 11:04 AM  °  °  Failed - Completed PHQ-2 or PHQ-9 in the last 360 days  °  °  Passed - Cr in normal range and within 360 days  °  Creatinine, Ser  °Date Value Ref Range Status  °12/16/2020 0.53 0.44 - 1.00 mg/dL Final  ° °Creatinine, Urine  °Date Value Ref Range Status  °12/16/2020 65 mg/dL Final  °   °  °  Passed - eGFR is 30 or above and within 360 days  °  GFR calc Af Amer  °Date Value Ref Range Status  °06/11/2019 >60 >60 mL/min Final  ° °GFR, Estimated  °Date Value Ref Range Status  °12/16/2020 >60 >60 mL/min Final  °  Comment:  °  (NOTE) °Calculated using the CKD-EPI Creatinine Equation (2021) °  °   °  °  Passed - Last BP in normal range  °  BP Readings from Last 1 Encounters:  °03/21/21 112/78  °   °  °  Passed - Valid encounter within last 6 months  °  Recent Outpatient Visits   °      ° 2 months ago Pain of upper abdomen  ° [facility] Gilbert, Richard L Jr., MD  ° 1 year ago Adjustment reaction with anxiety  ° [facility] Gilbert, Richard L Jr., MD  ° 3 years ago Pleurisy  ° [facility] Fisher, Donald E, MD  ° 3 years ago Chest pain, unspecified type  ° [facility] Gilbert, Richard L Jr., MD  ° 4 years ago Influenza  °  [facility] Gilbert, Richard L Jr., MD  °  °  ° °  °  °  ° ° °

## 2021-07-24 ENCOUNTER — Other Ambulatory Visit: Payer: Self-pay | Admitting: Family Medicine

## 2021-07-24 NOTE — Telephone Encounter (Signed)
Attempted to call patient- to schedule appointment- left message on VM to call office. Courtesy 30 day RF given Requested Prescriptions  Pending Prescriptions Disp Refills  . DULoxetine (CYMBALTA) 20 MG capsule [Pharmacy Med Name: DULOXETINE HCL DR 20 MG CAP] 90 capsule 0    Sig: TAKE 1 CAPSULE BY MOUTH EVERY DAY     Psychiatry: Antidepressants - SNRI - duloxetine Failed - 07/24/2021  3:28 AM      Failed - Completed PHQ-2 or PHQ-9 in the last 360 days      Failed - Valid encounter within last 6 months    Recent Outpatient Visits          6 months ago Pain of upper abdomen   [facility] Jerrol Banana., MD   1 year ago Adjustment reaction with anxiety   [facility] Jerrol Banana., MD   3 years ago Cannon, Donald E, MD   3 years ago Chest pain, unspecified type   [facility] Jerrol Banana., MD   4 years ago Influenza   [facility] Jerrol Banana., MD      Future Appointments            In 5 months Brendolyn Patty, MD [place] in normal range and within 360 days    Creatinine, Ser  Date Value Ref Range Status  12/16/2020 0.53 0.44 - 1.00 mg/dL Final   Creatinine, Urine  Date Value Ref Range Status  12/16/2020 65 mg/dL Final         Passed - eGFR is 30 or above and within 360 days    GFR calc Af Amer  Date Value Ref Range Status  06/11/2019 >60 >60 mL/min Final   GFR, Estimated  Date Value Ref Range Status  12/16/2020 >60 >60 mL/min Final    Comment:    (NOTE) Calculated using the CKD-EPI Creatinine Equation (2021)          Passed - Last BP in normal range    BP Readings from Last 1 Encounters:  03/21/21 112/78

## 2021-08-08 ENCOUNTER — Ambulatory Visit: Payer: BC Managed Care – PPO | Admitting: Dermatology

## 2021-08-08 ENCOUNTER — Encounter: Payer: Self-pay | Admitting: Dermatology

## 2021-08-08 DIAGNOSIS — L910 Hypertrophic scar: Secondary | ICD-10-CM | POA: Diagnosis not present

## 2021-08-08 DIAGNOSIS — L219 Seborrheic dermatitis, unspecified: Secondary | ICD-10-CM

## 2021-08-08 DIAGNOSIS — L821 Other seborrheic keratosis: Secondary | ICD-10-CM

## 2021-08-08 DIAGNOSIS — Z1283 Encounter for screening for malignant neoplasm of skin: Secondary | ICD-10-CM | POA: Diagnosis not present

## 2021-08-08 DIAGNOSIS — D2371 Other benign neoplasm of skin of right lower limb, including hip: Secondary | ICD-10-CM

## 2021-08-08 DIAGNOSIS — D1800 Hemangioma unspecified site: Secondary | ICD-10-CM

## 2021-08-08 DIAGNOSIS — L578 Other skin changes due to chronic exposure to nonionizing radiation: Secondary | ICD-10-CM

## 2021-08-08 DIAGNOSIS — D485 Neoplasm of uncertain behavior of skin: Secondary | ICD-10-CM | POA: Diagnosis not present

## 2021-08-08 DIAGNOSIS — B353 Tinea pedis: Secondary | ICD-10-CM

## 2021-08-08 DIAGNOSIS — L814 Other melanin hyperpigmentation: Secondary | ICD-10-CM

## 2021-08-08 DIAGNOSIS — Z86018 Personal history of other benign neoplasm: Secondary | ICD-10-CM

## 2021-08-08 DIAGNOSIS — D229 Melanocytic nevi, unspecified: Secondary | ICD-10-CM

## 2021-08-08 MED ORDER — KETOCONAZOLE 2 % EX SHAM: MEDICATED_SHAMPOO | CUTANEOUS | 11 refills | Status: DC

## 2021-08-08 NOTE — Patient Instructions (Addendum)
Athlete's foot/tinea pedis - For feet, start clotrimazole cream (available over the counter at the pharmacy) twice a day for 3 weeks and then as needed for scale.   When no longer pregnant or breast feeding - Recommend taking Heliocare sun protection supplement daily in sunny weather for additional sun protection. For maximum protection on the sunniest days, you can take up to 2 capsules of regular Heliocare OR take 1 capsule of Heliocare Ultra. For prolonged exposure (such as a full day in the sun), you can repeat your dose of the supplement 4 hours after your first dose. Heliocare can be purchased at [employer], at some [facility] or at [email or web address].    Due to recent changes in healthcare laws, you may see results of your pathology and/or laboratory studies on MyChart before the doctors have had a chance to review them. We understand that in some cases there may be results that are confusing or concerning to you. Please understand that not all results are received at the same time and often the doctors may need to interpret multiple results in order to provide you with the best plan of care or course of treatment. Therefore, we ask that you please give [place] 2 business days to thoroughly review all your results before contacting the office for clarification. Should we see a critical lab result, you will be contacted sooner.   If You Need Anything After Your Visit  If you have any questions or concerns for your doctor, please call our main line at [phone] and press option 4 to reach your doctor's medical assistant. If no one answers, please leave a voicemail as directed and we will return your call as soon as possible. Messages left after 4 pm will be answered the following business day.   You may also send [place] a message via [place]. We typically respond to MyChart messages within 1-2 business days.  For prescription refills, please ask your pharmacy to contact our office. Our fax  number is [phone].  If you have an urgent issue when the clinic is closed that cannot wait until the next business day, you can page your doctor at the number below.    Please note that while we do our best to be available for urgent issues outside of office hours, we are not available 24/7.   If you have an urgent issue and are unable to reach [place], you may choose to seek medical care at your doctor's office, retail clinic, urgent care center, or emergency room.  If you have a medical emergency, please immediately call 911 or go to the emergency department.  Pager Numbers  - Dr. Nehemiah Massed: [phone]  - Dr. Laurence Ferrari: [phone]  - Dr. Nicole Kindred: [phone]  In the event of inclement weather, please call our main line at [phone] for an update on the status of any delays or closures.  Dermatology Medication Tips: Please keep the boxes that topical medications come in in order to help keep track of the instructions about where and how to use these. Pharmacies typically print the medication instructions only on the boxes and not directly on the medication tubes.   If your medication is too expensive, please contact our office at [phone] option 4 or send [place] a message through [place].   We are unable to tell what your co-pay for medications will be in advance as this is different depending on your insurance coverage. However, we may be able to find a substitute medication at lower cost or  fill out paperwork to get insurance to cover a needed medication.   If a prior authorization is required to get your medication covered by your insurance company, please allow [place] 1-2 business days to complete this process.  Drug prices often vary depending on where the prescription is filled and some pharmacies may offer cheaper prices.  The website www.goodrx.com contains coupons for medications through different pharmacies. The prices here do not account for what the cost may be with help  from insurance (it may be cheaper with your insurance), but the website can give you the price if you did not use any insurance.  - You can print the associated coupon and take it with your prescription to the pharmacy.  - You may also stop by our office during regular business hours and pick up a GoodRx coupon card.  - If you need your prescription sent electronically to a different pharmacy, notify our office through [facility] or by phone at [phone] option 4.     Si Usted Necesita Algo Despus de Su Visita  Tambin puede enviarnos un mensaje a travs de Pharmacist, community. Por lo general respondemos a los mensajes de MyChart en el transcurso de 1 a 2 das hbiles.  Para renovar recetas, por favor pida a su farmacia que se ponga en contacto con nuestra oficina. Harland Dingwall de fax es [place] [phone].  Si tiene un asunto urgente cuando la clnica est cerrada y que no puede esperar hasta el siguiente da hbil, puede llamar/localizar a su doctor(a) al nmero que aparece a continuacin.   Por favor, tenga en cuenta que aunque hacemos todo lo posible para estar disponibles para asuntos urgentes fuera del horario de [place], no estamos disponibles las 24 horas del da, los 7 das de la [place].   Si tiene un problema urgente y no puede comunicarse con nosotros, puede optar por buscar atencin mdica  en el consultorio de su doctor(a), en una clnica privada, en un centro de atencin urgente o en una sala de emergencias.  Si tiene Engineering geologist, por favor llame inmediatamente al 911 o vaya a la sala de emergencias.  Nmeros de bper  - Dr. Nehemiah Massed: [phone]  - Dra. Moye: [phone]  - Dra. Nicole Kindred: [phone]  En caso de inclemencias del [place], por favor llame a Johnsie Kindred principal al [phone] para una actualizacin sobre el [place] de cualquier retraso o cierre.  Consejos para la medicacin en dermatologa: Por favor, guarde las cajas en las que vienen los  medicamentos de uso tpico para ayudarle a seguir las instrucciones sobre dnde y cmo usarlos. Las farmacias generalmente imprimen las instrucciones del medicamento slo en las cajas y no directamente en los tubos del [place].   Si su medicamento es muy caro, por favor, pngase en contacto con Zigmund Daniel llamando al [phone] y presione la opcin 4 o envenos un mensaje a travs de Pharmacist, community.   No podemos decirle cul ser su copago por los medicamentos por adelantado ya que esto es diferente dependiendo de la cobertura de su seguro. Sin embargo, es posible que podamos encontrar un medicamento sustituto a Electrical engineer un formulario para que el seguro cubra el medicamento que se considera necesario.   Si se requiere una autorizacin previa para que su compaa de seguros [place] su medicamento, por favor permtanos de 1 a 2 das hbiles para completar este proceso.  Los precios de los medicamentos varan con frecuencia dependiendo del Environmental consultant de dnde se surte la receta  y [place] farmacias pueden ofrecer precios ms baratos.  El sitio web www.goodrx.com tiene cupones para medicamentos de Airline pilot. Los precios aqu no tienen en cuenta lo que podra costar con la ayuda del seguro (puede ser ms barato con su seguro), pero el sitio web puede darle el precio si no utiliz Research scientist (physical sciences).  - Puede imprimir el cupn correspondiente y llevarlo con su receta a la farmacia.  - Tambin puede pasar por nuestra oficina durante el horario de atencin regular y Charity fundraiser una tarjeta de cupones de GoodRx.  - Si necesita que su receta se enve electrnicamente a una farmacia diferente, informe a nuestra oficina a travs de MyChart de [place] o por telfono llamando al [phone] y presione la opcin 4.

## 2021-08-08 NOTE — Progress Notes (Signed)
New Patient Visit  Subjective  Ariel Thompson is a 31 y.o. female who presents for the following: Annual Exam (Dandruff and scaly patches on the scalp - patient currently using Nizoral, worsens during seasonal changes. Fhx of MM in grandmother, hx BCC in mother, father, and sister. Dark nevus on L breast patient is concerned about and would like checked, personal hx dysplastic nevus). The patient presents for Total-Body Skin Exam (TBSE) for skin cancer screening and mole check.  The patient has spots, moles and lesions to be evaluated, some may be new or changing.  The following portions of the chart were reviewed this encounter and updated as appropriate:   Tobacco  Allergies  Meds  Problems  Med Hx  Surg Hx  Fam Hx      Review of Systems:  No other skin or systemic complaints except as noted in HPI or Assessment and Plan.  Objective  Well appearing patient in no apparent distress; mood and affect are within normal limits.  A full examination was performed including scalp, head, eyes, ears, nose, lips, neck, chest, axillae, abdomen, back, buttocks, bilateral upper extremities, bilateral lower extremities, hands, feet, fingers, toes, fingernails, and toenails. All findings within normal limits unless otherwise noted below.  L inframammary 0.6 cm erythematous papule.     Upper mid abdomen Hypertrophic plaque  B/L foot Scaling and maceration web spaces and over distal and lateral soles.   Scalp Pink patches with greasy scale.     Assessment & Plan  Neoplasm of uncertain behavior of skin L inframammary  Epidermal / dermal shaving  Lesion diameter (cm):  0.6 Informed consent: discussed and consent obtained   Patient was prepped and draped in usual sterile fashion: area prepped with alcohol. Anesthesia: the lesion was anesthetized in a standard fashion   Anesthetic:  1% lidocaine w/ epinephrine 1-100,000 buffered w/ 8.4% NaHCO3 Instrument used: scissors    Instrument used comment:  Snip excision Hemostasis achieved with: pressure, aluminum chloride and electrodesiccation   Outcome: patient tolerated procedure well   Post-procedure details: wound care instructions given   Post-procedure details comment:  Ointment and bandaid applied  Specimen 1 - Surgical pathology Differential Diagnosis: D48.5 R/o irritated skin tag vs nevus vs other - symptomatic, rubs on clothing  Check Margins: No  R/o irritated skin tag vs nevus vs other - symptomatic, rubs on clothing  Hypertrophic scar Upper mid abdomen  Symptomatic, secondary to gallbladder removal  Intralesional injection - Upper mid abdomen Location: upper mid abdomen  Informed Consent: Discussed risks (infection, pain, bleeding, bruising, thinning of the skin, loss of skin pigment, lack of resolution, and recurrence of lesion) and benefits of the procedure, as well as the alternatives. Informed consent was obtained. Preparation: The area was prepared a standard fashion.  Procedure Details: An intralesional injection was performed with Kenalog 5 mg/cc. 0.4 cc in total were injected.  Total number of injections: 3  CBJ:[id number]  Plan: The patient was instructed on post-op care. Recommend OTC analgesia as needed for pain.   Tinea pedis of both feet B/L foot  For feet, start clotrimazole cream (available over the counter at the pharmacy) twice a day for 3 weeks and then as needed for scale.   Seborrheic dermatitis Scalp  Chronic and persistent condition with duration or expected duration over one year. Condition is symptomatic/ bothersome to patient. Not currently at goal.  Seborrheic Dermatitis  -  is a chronic persistent rash characterized by pinkness and scaling most commonly of the  mid face but also can occur on the scalp (dandruff), ears; mid chest, mid back and groin.  It tends to be exacerbated by stress and cooler weather.  People who have neurologic disease may  experience new onset or exacerbation of existing seborrheic dermatitis.  The condition is not curable but treatable and can be controlled.  Continue Ketoconazole 2% shampoo let sit 5-10 minutes before washing out. Use 3d/wk.   ketoconazole (NIZORAL) 2 % shampoo - Scalp Shampoo into scalp let sit 5-10 minutes then wash out. Use 3d/wk.   Lentigines - Scattered tan macules - Due to sun exposure - Benign-appearing, observe - Recommend daily broad spectrum sunscreen SPF 30+ to sun-exposed areas, reapply every 2 hours as needed. - Call for any changes  Seborrheic Keratoses - Stuck-on, waxy, tan-brown papules and/or plaques  - Benign-appearing - Discussed benign etiology and prognosis. - Observe - Call for any changes  Melanocytic Nevi - Tan-brown and/or pink-flesh-colored symmetric macules and papules - Benign appearing on exam today - Observation - Call clinic for new or changing moles - Recommend daily use of broad spectrum spf 30+ sunscreen to sun-exposed areas.   Hemangiomas - Red papules - Discussed benign nature - Observe - Call for any changes  Actinic Damage - Chronic condition, secondary to cumulative UV/sun exposure - diffuse scaly erythematous macules with underlying dyspigmentation - Recommend daily broad spectrum sunscreen SPF 30+ to sun-exposed areas, reapply every 2 hours as needed.  - Staying in the shade or wearing long sleeves, sun glasses (UVA+UVB protection) and wide brim hats (4-inch brim around the entire circumference of the hat) are also recommended for sun protection.  - Call for new or changing lesions.  History of Dysplastic Nevus - L thigh, mild 2011 - No evidence of recurrence today - Recommend regular full body skin exams - Recommend daily broad spectrum sunscreen SPF 30+ to sun-exposed areas, reapply every 2 hours as needed.  - Call if any new or changing lesions are noted between office visits  Dermatofibroma - R post thigh  - Firm pink/brown  papulenodule with dimple sign - Benign appearing - Call for any changes  Skin cancer screening performed today.  Return in about 1 year (around 08/09/2022) for TBSE.  Luther Redo, CMA, am acting as scribe for Forest Gleason, MD .  Documentation: I have reviewed the above documentation for accuracy and completeness, and I agree with the above.  Forest Gleason, MD

## 2021-08-12 ENCOUNTER — Telehealth: Payer: Self-pay

## 2021-08-12 NOTE — Telephone Encounter (Signed)
LM on VM please return my call  

## 2021-08-26 ENCOUNTER — Other Ambulatory Visit: Payer: Self-pay | Admitting: Family Medicine

## 2021-10-18 ENCOUNTER — Ambulatory Visit: Payer: Self-pay

## 2021-10-18 ENCOUNTER — Telehealth: Payer: BC Managed Care – PPO | Admitting: Physician Assistant

## 2021-10-18 DIAGNOSIS — J209 Acute bronchitis, unspecified: Secondary | ICD-10-CM

## 2021-10-18 MED ORDER — ALBUTEROL SULFATE HFA 108 (90 BASE) MCG/ACT IN AERS
2.0000 | INHALATION_SPRAY | Freq: Four times a day (QID) | RESPIRATORY_TRACT | 0 refills | Status: DC | PRN
Start: 2021-10-18 — End: 2022-10-01

## 2021-10-18 MED ORDER — AMOXICILLIN 875 MG PO TABS: 875.0000 mg | ORAL_TABLET | Freq: Two times a day (BID) | ORAL | 0 refills | Status: AC

## 2021-10-18 NOTE — Telephone Encounter (Signed)
  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 SOB and wheezing at times started 24-36 hrs ago. Scheduled pt for virtual UC visit at 1730 today.   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 Pt requests call back for some advice because she has been having a persistent cough. Pt would like to speak with clinical staff before scheduling an appt. Cb# [phone]      Reason for Disposition  Wheezing is present  Answer Assessment - Initial Assessment Questions 1. ONSET: "When did the cough begin?"      End of last week  2. SEVERITY: "How bad is the cough today?"      Mild to moderate 3. SPUTUM: "Describe the color of your sputum" (none, dry cough; clear, white, yellow, green)     None coming up 5. DIFFICULTY BREATHING: "Are you having difficulty breathing?" If Yes, ask: "How bad is it?" (e.g., mild, moderate, severe)    - MILD: No SOB at rest, mild SOB with walking, speaks normally in sentences, can lie down, no retractions, pulse < 100.    - MODERATE: SOB at rest, SOB with minimal exertion and prefers to sit, cannot lie down flat, speaks in phrases, mild retractions, audible wheezing, pulse 100-120.    - SEVERE: Very SOB at rest, speaks in single words, struggling to breathe, sitting hunched forward, retractions, pulse > 120      Yes with exertion  10. OTHER SYMPTOMS: "Do you have any other symptoms?" (e.g., runny nose, wheezing, chest pain)       Cough,  wheezing with chest tightness  Protocols used: Cough - Acute Non-Productive-A-AH

## 2021-10-18 NOTE — Progress Notes (Signed)
Virtual Visit Consent   VIVIANNA PICCINI, you are scheduled for a virtual visit with a [facility] provider today. Just as with appointments in the office, your consent must be obtained to participate. Your consent will be active for this visit and any virtual visit you may have with one of our providers in the next 365 days. If you have a MyChart account, a copy of this consent can be sent to you electronically.  As this is a virtual visit, video technology does not allow for your provider to perform a traditional examination. This may limit your provider's ability to fully assess your condition. If your provider identifies any concerns that need to be evaluated in person or the need to arrange testing (such as labs, EKG, etc.), we will make arrangements to do so. Although advances in technology are sophisticated, we cannot ensure that it will always work on either your end or our end. If the connection with a video visit is poor, the visit may have to be switched to a telephone visit. With either a video or telephone visit, we are not always able to ensure that we have a secure connection.  By engaging in this virtual visit, you consent to the provision of healthcare and authorize for your insurance to be billed (if applicable) for the services provided during this visit. Depending on your insurance coverage, you may receive a charge related to this service.  I need to obtain your verbal consent now. Are you willing to proceed with your visit today? Ariel Thompson has provided verbal consent on 10/18/2021 for a virtual visit (video or telephone). Leeanne Rio, [place]  Date: 10/18/2021 5:40 PM  Virtual Visit via Video Note   I, Leeanne Rio, connected with  Ariel Thompson  ([id number], September 16, 1990) on 10/18/21 at  5:30 PM EDT by a video-enabled telemedicine application and verified that I am speaking with the correct person using two identifiers.  Location: Patient: Virtual Visit  Location Patient: Home Provider: Virtual Visit Location Provider: Home Office   I discussed the limitations of evaluation and management by telemedicine and the availability of in person appointments. The patient expressed understanding and agreed to proceed.    History of Present Illness: Ariel Thompson is a 31 y.o. who identifies as a female who was assigned female at birth, and is being seen today for cough, chest tightness and wheezing starting at the end of last week.  Notes symptoms started while the HVAC was getting cleaned at work.  Notes her office is right next to where the cleaning was occurring.  Notes that her cough is progressed since onset.  Was initially dry but over the past 48 hours has become productive of thick phlegm.  Denies fever or chills.  Chest tightness and wheezing are intermittent.  Associated with some shortness of breath with more substantial exertion.  Denies history of asthma.  Does have history of seasonal allergies for which she takes Allegra daily.  Denies any substantial sinus symptoms.  Took a COVID test just to be cautious which was negative.     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   Medications:  Current Outpatient Medications:     albuterol (VENTOLIN HFA) 108 (90 Base) MCG/ACT inhaler, Inhale 2 puffs into the lungs every 6 (six) hours as needed for wheezing or shortness of breath., Disp: 8 g, Rfl: 0   amoxicillin (AMOXIL) 875 MG tablet, Take 1 tablet (875 mg total) by mouth 2 (two) times daily for 10 days., Disp: 20 tablet, Rfl: 0   DULoxetine (CYMBALTA) 20 MG capsule, TAKE 1 CAPSULE BY MOUTH EVERY DAY, Disp: 90 capsule, Rfl: 1   fexofenadine (ALLEGRA) 180 MG tablet, Take 180 mg by mouth at bedtime., Disp: , Rfl:    ibuprofen (ADVIL) 600 MG tablet, Take 1 tablet (600 mg total) by mouth every 6 (six) hours. (Patient taking differently: Take 600 mg by mouth every 6 (six) hours as needed for mild pain or moderate pain.), Disp: 30 tablet, Rfl: 0   ketoconazole (NIZORAL) 2 % shampoo, Shampoo into scalp let sit 5-10 minutes then wash out. Use 3d/wk., Disp: 120 mL, Rfl: 11   levonorgestrel (MIRENA) 20 MCG/DAY IUD, 1 each by Intrauterine route once., Disp: , Rfl:    Prenatal MV & Min w/FA-DHA (PRENATAL ADULT GUMMY/DHA/FA PO), Take 1 tablet by mouth at bedtime., Disp: , Rfl:   Observations/Objective: Patient is well-developed, well-nourished in no acute distress.  Resting comfortably at home.  Head is normocephalic, atraumatic.  No labored breathing. Speech is clear and coherent with logical content.  Patient is alert and oriented at baseline.   Assessment and Plan: 1. Acute bronchitis, unspecified organism - albuterol (VENTOLIN HFA) 108 (90 Base) MCG/ACT inhaler; Inhale 2 puffs into the lungs every 6 (six) hours as needed for wheezing or shortness of breath.  Dispense: 8 g; Refill: 0 - amoxicillin (AMOXIL) 875 MG tablet; Take 1 tablet (875 mg total) by mouth 2 (two) times daily for 10 days.  Dispense: 20 tablet; Refill: 0  With some reactive airway.  We will start Amoxil 875 twice daily as she is breast-feeding.  Albuterol per orders.  OTC medications reviewed that would be safe for breast-feeding.  Strict in person evaluation  precautions reviewed with patient who voiced understanding and agreement with the plan.  Follow Up Instructions: I discussed the assessment and treatment plan with the patient. The patient was provided an opportunity to ask questions and all were answered. The patient agreed with the plan and demonstrated an understanding of the instructions.  A copy of instructions were sent to the patient via MyChart unless otherwise noted below.    The patient was advised to call back or seek an in-person evaluation if the symptoms worsen or if the condition fails to improve as anticipated.  Time:  I spent 10 minutes with the patient via telehealth technology discussing the above problems/concerns.    Leeanne Rio, PA-C

## 2021-10-18 NOTE — Patient Instructions (Signed)
  Clista Bernhardt, thank you for joining Leeanne Rio, PA-C for today's virtual visit.  While this provider is not your primary care provider (PCP), if your PCP is located in our provider database this encounter information will be shared with them immediately following your visit.  Consent: (Patient) Ariel Thompson provided verbal consent for this virtual visit at the beginning of the encounter.  Current Medications:  Current Outpatient Medications:    DULoxetine (CYMBALTA) 20 MG capsule, TAKE 1 CAPSULE BY MOUTH EVERY DAY, Disp: 90 capsule, Rfl: 1   esomeprazole (NEXIUM) 20 MG capsule, Take 20 mg by mouth at bedtime., Disp: , Rfl:    fexofenadine (ALLEGRA) 180 MG tablet, Take 180 mg by mouth at bedtime., Disp: , Rfl:    ibuprofen (ADVIL) 600 MG tablet, Take 1 tablet (600 mg total) by mouth every 6 (six) hours. (Patient taking differently: Take 600 mg by mouth every 6 (six) hours as needed for mild pain or moderate pain.), Disp: 30 tablet, Rfl: 0   ketoconazole (NIZORAL) 2 % shampoo, Shampoo into scalp let sit 5-10 minutes then wash out. Use 3d/wk., Disp: 120 mL, Rfl: 11   levonorgestrel (MIRENA) 20 MCG/DAY IUD, 1 each by Intrauterine route once., Disp: , Rfl:    Prenatal MV & Min w/FA-DHA (PRENATAL ADULT GUMMY/DHA/FA PO), Take 1 tablet by mouth at bedtime., Disp: , Rfl:    Medications ordered in this encounter:  No orders of the defined types were placed in this encounter.    *If you need refills on other medications prior to your next appointment, please contact your pharmacy*  Follow-Up: Call back or seek an in-person evaluation if the symptoms worsen or if the condition fails to improve as anticipated.  Other Instructions Please keep well-hydrated and get plenty of rest. It is okay for you to take over-the-counter Mucinex DM if needed for congestion and cough. Use the albuterol as directed when needed for chest tightness/wheezing. Take the antibiotic as directed with food.   Remember to monitor your little 1 for any bowel changes.  If noticing these, please contact the pediatrician. If you have a humidifier, running in the bedroom at night. Try to mask at work until they get the system fully cleaned out, to prevent as much repeat exposure to allergens as possible. If symptoms not resolving or you notice new or worsening symptoms despite treatment, please seek an in person evaluation ASAP.  Please do not delay care.   If you have been instructed to have an in-person evaluation today at a local Urgent Care facility, please use the link below. It will take you to a list of all of our available [facility], including address, phone number and hours of operation. Please do not delay care.  [facility]  If you or a family member do not have a primary care provider, use the link below to schedule a visit and establish care. When you choose a [facility] primary care physician or advanced practice provider, you gain a long-term partner in health. Find a Primary Care Provider  Learn more about [facility]'s in-office and virtual care options: Reading Now

## 2022-01-14 ENCOUNTER — Ambulatory Visit: Payer: BC Managed Care – PPO | Admitting: Dermatology

## 2022-02-28 ENCOUNTER — Telehealth: Payer: BC Managed Care – PPO | Admitting: Physician Assistant

## 2022-02-28 DIAGNOSIS — F419 Anxiety disorder, unspecified: Secondary | ICD-10-CM | POA: Diagnosis not present

## 2022-02-28 DIAGNOSIS — F3342 Major depressive disorder, recurrent, in full remission: Secondary | ICD-10-CM

## 2022-02-28 MED ORDER — DULOXETINE HCL 20 MG PO CPEP: ORAL_CAPSULE | ORAL | 0 refills | Status: DC

## 2022-02-28 NOTE — Patient Instructions (Signed)
Ariel Thompson, thank you for joining Kennieth Rad, PA-C for today's virtual visit.  While this provider is not your primary care provider (PCP), if your PCP is located in our provider database this encounter information will be shared with them immediately following your visit.   [facility] account gives you access to today's visit and all your visits, tests, and labs performed at [facility] " click here if you don't have a [facility] account or go to [email or web address]  Consent: (Patient) Ariel Thompson provided verbal consent for this virtual visit at the beginning of the encounter.  Current Medications:  Current Outpatient Medications:    albuterol (VENTOLIN HFA) 108 (90 Base) MCG/ACT inhaler, Inhale 2 puffs into the lungs every 6 (six) hours as needed for wheezing or shortness of breath., Disp: 8 g, Rfl: 0   DULoxetine (CYMBALTA) 20 MG capsule, TAKE 1 CAPSULE BY MOUTH EVERY DAY, Disp: 90 capsule, Rfl: 0   fexofenadine (ALLEGRA) 180 MG tablet, Take 180 mg by mouth at bedtime., Disp: , Rfl:    ibuprofen (ADVIL) 600 MG tablet, Take 1 tablet (600 mg total) by mouth every 6 (six) hours. (Patient taking differently: Take 600 mg by mouth every 6 (six) hours as needed for mild pain or moderate pain.), Disp: 30 tablet, Rfl: 0   ketoconazole (NIZORAL) 2 % shampoo, Shampoo into scalp let sit 5-10 minutes then wash out. Use 3d/wk., Disp: 120 mL, Rfl: 11   levonorgestrel (MIRENA) 20 MCG/DAY IUD, 1 each by Intrauterine route once., Disp: , Rfl:    Prenatal MV & Min w/FA-DHA (PRENATAL ADULT GUMMY/DHA/FA PO), Take 1 tablet by mouth at bedtime., Disp: , Rfl:    Medications ordered in this encounter:  Meds ordered this encounter  Medications   DULoxetine (CYMBALTA) 20 MG capsule    Sig: TAKE 1 CAPSULE BY MOUTH EVERY DAY    Dispense:  90 capsule    Refill:  0    Order Specific Question:   Supervising Provider    Answer:   Chase Picket [id number]      *If you need refills on other medications prior to your next appointment, please contact your pharmacy*  Follow-Up: Call back or seek an in-person evaluation if the symptoms worsen or if the condition fails to improve as anticipated.  [place] [phone]  Other Instructions Managing Depression, Adult Depression is a mental health condition that affects your thoughts, feelings, and actions. Being diagnosed with depression can bring you relief if you did not know why you have felt or behaved a certain way. It could also leave you feeling overwhelmed. Finding ways to manage your symptoms can help you feel more positive about your future. How to manage lifestyle changes Being depressed is difficult. Depression can increase the level of everyday stress. Stress can make depression symptoms worse. You may believe your symptoms cannot be managed or will never improve. However, there are many things you can try to help manage your symptoms. There is hope. Managing stress  Stress is your body's reaction to life changes and events, both good and bad. Stress can add to your feelings of depression. Learning to manage your stress can help lessen your feelings of depression. Try some of the following approaches to reducing your stress (stress reduction techniques): Listen to music that you enjoy and that inspires you. Try using a meditation app or take a meditation class. Develop a practice that helps you connect with your spiritual  self. Walk in nature, pray, or go to a place of worship. Practice deep breathing. To do this, inhale slowly through your nose. Pause at the top of your inhale for a few seconds and then exhale slowly, letting yourself relax. Repeat this three or four times. Practice yoga to help relax and work your muscles. Choose a stress reduction technique that works for you. These techniques take time and practice to develop. Set aside 5-15 minutes a day to do them.  Therapists can offer training in these techniques. Do these things to help manage stress: Keep a journal. Know your limits. Set healthy boundaries for yourself and others, such as saying "no" when you think something is too much. Pay attention to how you react to certain situations. You may not be able to control everything, but you can change your reaction. Add humor to your life by watching funny movies or shows. Make time for activities that you enjoy and that relax you. Spend less time using electronics, especially at night before bed. The light from screens can make your brain think it is time to get up rather than go to bed.  Medicines Medicines, such as antidepressants, are often a part of treatment for depression. Talk with your pharmacist or health care provider about all the medicines, supplements, and herbal products that you take, their possible side effects, and what medicines and other products are safe to take together. Make sure to report any side effects you may have to your health care provider. Relationships Your health care provider may suggest family therapy, couples therapy, or individual therapy as part of your treatment. How to recognize changes Everyone responds differently to treatment for depression. As you recover from depression, you may start to: Have more interest in doing activities. Feel more hopeful. Have more energy. Eat a more regular amount of food. Have better mental focus. It is important to recognize if your depression is not getting better or is getting worse. The symptoms you had in the beginning may return, such as: Feeling tired. Eating too much or too little. Sleeping too much or too little. Feeling restless, agitated, or hopeless. Trouble focusing or making decisions. Having unexplained aches and pains. Feeling irritable, angry, or aggressive. If you or your family members notice these symptoms coming back, let your health care provider know  right away. Follow these instructions at home: Activity Try to get some form of exercise each day, such as walking. Try yoga, mindfulness, or other stress reduction techniques. Participate in group activities if you are able. Lifestyle Get enough sleep. Cut down on or stop using caffeine, tobacco, alcohol, and any other harmful substances. Eat a healthy diet that includes plenty of vegetables, fruits, whole grains, low-fat dairy products, and lean protein. Limit foods that are high in solid fats, added sugar, or salt (sodium). General instructions Take over-the-counter and prescription medicines only as told by your health care provider. Keep all follow-up visits. It is important for your health care provider to check on your mood, behavior, and medicines. Your health care provider may need to make changes to your treatment. Where to find support Talking to others  Friends and family members can be sources of support and guidance. Talk to trusted friends or family members about your condition. Explain your symptoms and let them know that you are working with a health care provider to treat your depression. Tell friends and family how they can help. Finances Find mental health providers that fit with your financial situation.  Talk with your health care provider if you are worried about access to food, housing, or medicine. Call your insurance company to learn about your co-pays and prescription plan. Where to find more information You can find support in your area from: Anxiety and Depression Association of America (ADAA): adaa.org Mental Health America: mentalhealthamerica.net [employer] on Mental Illness: nami.org Contact a health care provider if: You stop taking your antidepressant medicines, and you have any of these symptoms: Nausea. Headache. Light-headedness. Chills and body aches. Not being able to sleep (insomnia). You or your friends and family think your depression  is getting worse. Get help right away if: You have thoughts of hurting yourself or others. Get help right away if you feel like you may hurt yourself or others, or have thoughts about taking your own life. Go to your nearest emergency room or: Call 911. Call the [facility] at [phone] or 988. This is open 24 hours a day. Text the Crisis Text Line at (303) 849-8815. This information is not intended to replace advice given to you by your health care provider. Make sure you discuss any questions you have with your health care provider. Document Revised: 06/11/2021 Document Reviewed: 06/11/2021 Elsevier Patient Education  [place].    If you have been instructed to have an in-person evaluation today at a local Urgent Care facility, please use the link below. It will take you to a list of all of our available [facility], including address, phone number and hours of operation. Please do not delay care.  [facility]  If you or a family member do not have a primary care provider, use the link below to schedule a visit and establish care. When you choose a [facility] primary care physician or advanced practice provider, you gain a long-term partner in health. Find a Primary Care Provider  Learn more about [facility]'s in-office and virtual care options: [facility] Now

## 2022-02-28 NOTE — Progress Notes (Signed)
Virtual Visit Consent   Ariel Thompson, you are scheduled for a virtual visit with a [facility] provider today. Just as with appointments in the office, your consent must be obtained to participate. Your consent will be active for this visit and any virtual visit you may have with one of our providers in the next 365 days. If you have a MyChart account, a copy of this consent can be sent to you electronically.  As this is a virtual visit, video technology does not allow for your provider to perform a traditional examination. This may limit your provider's ability to fully assess your condition. If your provider identifies any concerns that need to be evaluated in person or the need to arrange testing (such as labs, EKG, etc.), we will make arrangements to do so. Although advances in technology are sophisticated, we cannot ensure that it will always work on either your end or our end. If the connection with a video visit is poor, the visit may have to be switched to a telephone visit. With either a video or telephone visit, we are not always able to ensure that we have a secure connection.  By engaging in this virtual visit, you consent to the provision of healthcare and authorize for your insurance to be billed (if applicable) for the services provided during this visit. Depending on your insurance coverage, you may receive a charge related to this service.  I need to obtain your verbal consent now. Are you willing to proceed with your visit today? Ariel Thompson has provided verbal consent on 02/28/2022 for a virtual visit (video or telephone). Kennieth Rad, PA-C  Date: 02/28/2022 6:23 PM  Virtual Visit via Video Note   I, Ariel Thompson, connected with  Ariel Thompson  ([id number], 1990-10-18) on 02/28/22 at  6:15 PM EST by a video-enabled telemedicine application and verified that I am speaking with the correct person using two identifiers.  Location: Patient: Virtual Visit Location  Patient: Home Provider: Virtual Visit Location Provider: Home Office   I discussed the limitations of evaluation and management by telemedicine and the availability of in person appointments. The patient expressed understanding and agreed to proceed.    History of Present Illness: Ariel Thompson is a 32 y.o. who identifies as a female who was assigned female at birth, and is being seen today for medication refill.  States that she has been taking Cymbalta for several years, states that her mood is stable, appetite is good.  States that she unfortunately missed her appointment with her primary care provider due to her daughter's illness and will not have enough medication until she is able to see her primary care provider again.  Adamantly denies any thoughts of self-harm.  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   Medications:  Current Outpatient Medications:    albuterol (VENTOLIN HFA) 108 (90 Base) MCG/ACT inhaler, Inhale 2 puffs into the lungs every 6 (six) hours as needed for wheezing or shortness of breath., Disp: 8 g, Rfl: 0   DULoxetine (CYMBALTA) 20 MG capsule, TAKE 1 CAPSULE BY MOUTH EVERY DAY, Disp: 90 capsule, Rfl: 0   fexofenadine (ALLEGRA)  180 MG tablet, Take 180 mg by mouth at bedtime., Disp: , Rfl:    ibuprofen (ADVIL) 600 MG tablet, Take 1 tablet (600 mg total) by mouth every 6 (six) hours. (Patient taking differently: Take 600 mg by mouth every 6 (six) hours as needed for mild pain or moderate pain.), Disp: 30 tablet, Rfl: 0   ketoconazole (NIZORAL) 2 % shampoo, Shampoo into scalp let sit 5-10 minutes then wash out. Use 3d/wk., Disp: 120 mL, Rfl: 11   levonorgestrel (MIRENA) 20 MCG/DAY IUD, 1 each by Intrauterine route once., Disp: , Rfl:    Prenatal MV & Min w/FA-DHA (PRENATAL ADULT GUMMY/DHA/FA PO), Take 1 tablet by mouth at bedtime., Disp: , Rfl:   Observations/Objective: Patient is well-developed, well-nourished in no acute distress.  Resting comfortably  at home.  Head is normocephalic, atraumatic.  No labored breathing.  Speech is clear and coherent with logical content.  Patient is alert and oriented at baseline.    Assessment and Plan: 1. Recurrent major depressive disorder, in full remission (Ariel Thompson) - DULoxetine (CYMBALTA) 20 MG capsule; TAKE 1 CAPSULE BY MOUTH EVERY DAY  Dispense: 90 capsule; Refill: 0  2. Anxiety - DULoxetine (CYMBALTA) 20 MG capsule; TAKE 1 CAPSULE BY MOUTH EVERY DAY  Dispense: 90 capsule; Refill: 0  Refill appropriate, patient to follow-up with primary care provider for further refills.  Red flags given for prompt reevaluation  Follow Up Instructions: I discussed the assessment and treatment plan with the patient. The patient was provided an opportunity to ask questions and all were answered. The patient agreed with the plan and demonstrated an understanding of the instructions.  A copy of instructions were sent to the patient via MyChart unless otherwise noted below.     The patient was advised to call back or seek an in-person evaluation if the symptoms worsen or if the condition fails to improve as anticipated.  Time:  I spent 8 minutes with the patient via telehealth technology  discussing the above problems/concerns.    Loraine Grip Mayers, PA-C

## 2022-05-07 ENCOUNTER — Emergency Department: Payer: BC Managed Care – PPO

## 2022-05-07 ENCOUNTER — Other Ambulatory Visit: Payer: Self-pay

## 2022-05-07 ENCOUNTER — Emergency Department
Admission: EM | Admit: 2022-05-07 | Discharge: 2022-05-07 | Disposition: A | Discharge status: Home / Self Care | Payer: BC Managed Care – PPO | Attending: Emergency Medicine | Admitting: Emergency Medicine

## 2022-05-07 DIAGNOSIS — I10 Essential (primary) hypertension: Secondary | ICD-10-CM | POA: Diagnosis not present

## 2022-05-07 DIAGNOSIS — R002 Palpitations: Secondary | ICD-10-CM | POA: Insufficient documentation

## 2022-05-07 LAB — CBC
HCT: 45.9 % (ref 36.0–46.0)
Hemoglobin: 15.2 g/dL — ABNORMAL HIGH (ref 12.0–15.0)
MCH: 28 pg (ref 26.0–34.0)
MCHC: 33.1 g/dL (ref 30.0–36.0)
MCV: 84.5 fL (ref 80.0–100.0)
Platelets: 449 10*3/uL — ABNORMAL HIGH (ref 150–400)
RBC: 5.43 MIL/uL — ABNORMAL HIGH (ref 3.87–5.11)
RDW: 12.5 % (ref 11.5–15.5)
WBC: 6.9 10*3/uL (ref 4.0–10.5)
nRBC: 0 % (ref 0.0–0.2)

## 2022-05-07 LAB — BASIC METABOLIC PANEL
Anion gap: 5 (ref 5–15)
BUN: 10 mg/dL (ref 6–20)
CO2: 26 mmol/L (ref 22–32)
Calcium: 9.5 mg/dL (ref 8.9–10.3)
Chloride: 105 mmol/L (ref 98–111)
Creatinine, Ser: 0.8 mg/dL (ref 0.44–1.00)
GFR, Estimated: >60 mL/min (ref >60)
Glucose, Bld: 102 mg/dL — ABNORMAL HIGH (ref 70–99)
Potassium: 4.3 mmol/L (ref 3.5–5.1)
Sodium: 136 mmol/L (ref 135–145)

## 2022-05-07 LAB — TROPONIN I (HIGH SENSITIVITY): Troponin I (High Sensitivity): 2 ng/L (ref <18)

## 2022-05-07 NOTE — ED Triage Notes (Signed)
Pt comes with c/o heart racing and sob. Pt states she went to school nurse and had her BP checked. Pt states it was elevated. Pt states some dizziness. Pt denies any hx of this and no meds.

## 2022-05-07 NOTE — ED Provider Notes (Signed)
[facility] Provider Note    Event Date/Time   First MD Initiated Contact with Patient 05/07/22 1402     (approximate)  History   Chief Complaint: Hypertension  HPI  Ariel Thompson is a 32 y.o. female with a past medical history of depression presents to the emergency department for high blood pressure and palpitations.  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  School nurse checked her blood pressure and it was 0000000 systolic and referred her to her PCP.  She spoke to her PCP who then referred her to the emergency department.  Here the patient states she is feeling much better, denies any chest discomfort.  No current palpitations.  Blood pressure currently 137/93.  Physical Exam   Triage Vital Signs: ED Triage Vitals [05/07/22 1200]  Enc Vitals Group     BP (!) 153/112     Pulse Rate (!) 107     Resp 17     Temp 98.3 F (36.8 C)     Temp Source Oral     SpO2 100 %     Weight      Height      Head Circumference      Peak Flow      Pain Score      Pain Loc      Pain Edu?      Excl. in [place]?     Most recent vital signs: Vitals:   05/07/22 1200 05/07/22 1400  BP: (!) 153/112 (!) 137/93  Pulse: (!) 107   Resp: 17   Temp: 98.3 F (36.8 C)   SpO2: 100%     General: Awake, no distress.  CV:  Good peripheral perfusion.  Regular rate and rhythm  Resp:  Normal effort.  Equal breath sounds bilaterally.  Abd:  No distention.  Soft, nontender.  No rebound or guarding.   ED Results / Procedures / Treatments   EKG  EKG viewed and interpreted by myself shows sinus rhythm at 94 bpm with a narrow QRS, normal axis, normal intervals, no concerning ST changes.  RADIOLOGY  Chest x-ray viewed and interpreted by myself shows no consolidation on my  evaluation. Radiology has read the x-ray as negative.   MEDICATIONS ORDERED IN ED: Medications - No data to display   IMPRESSION / MDM / [place] / ED COURSE  I reviewed the triage vital signs and the nursing notes.  Patient's presentation is most consistent with acute presentation with potential threat to life or bodily function.  Patient presents emergency department for palpitations high blood pressure.  Overall the patient appears well, blood pressure is downtrending currently down to 137/93 without intervention.  Patient CBC is normal, chemistry is reassuring troponin is negative.  EKG reassuring and chest x-ray is clear.  Patient has a PCP with whom she follows up and states she has never had any issues with her blood pressure previously except during her pregnancy.  Given the patient's reassuring physical exam reassuring workup reassuring labs and EKG I believe the patient is safe for discharge home.  I discussed with the patient have her follow-up with her PCP but also discussed/provided return precautions.  Patient is agreeable to plan of care.  FINAL CLINICAL IMPRESSION(S) / ED DIAGNOSES  Palpitations Hypertension, transient  Note:  This document was prepared using Dragon voice recognition software and may include unintentional dictation errors.   Harvest Dark, MD 05/07/22 1445

## 2022-05-07 NOTE — Discharge Instructions (Addendum)
As we discussed your workup in the emergency department has shown reassuring results including blood work EKG and a chest x-ray.  Please follow-up with your doctor for recheck/reevaluation.  Return to the emergency department for any chest pain trouble breathing or any other symptom personally concerning to yourself.

## 2022-08-13 ENCOUNTER — Encounter: Payer: BC Managed Care – PPO | Admitting: Dermatology

## 2022-08-19 ENCOUNTER — Other Ambulatory Visit: Payer: Self-pay | Admitting: Dermatology

## 2022-08-19 DIAGNOSIS — L219 Seborrheic dermatitis, unspecified: Secondary | ICD-10-CM

## 2022-10-01 ENCOUNTER — Encounter: Payer: Self-pay | Admitting: Dermatology

## 2022-10-01 ENCOUNTER — Ambulatory Visit (INDEPENDENT_AMBULATORY_CARE_PROVIDER_SITE_OTHER): Payer: BC Managed Care – PPO | Admitting: Dermatology

## 2022-10-01 DIAGNOSIS — D229 Melanocytic nevi, unspecified: Secondary | ICD-10-CM | POA: Diagnosis not present

## 2022-10-01 DIAGNOSIS — L814 Other melanin hyperpigmentation: Secondary | ICD-10-CM

## 2022-10-01 DIAGNOSIS — L219 Seborrheic dermatitis, unspecified: Secondary | ICD-10-CM

## 2022-10-01 DIAGNOSIS — L578 Other skin changes due to chronic exposure to nonionizing radiation: Secondary | ICD-10-CM

## 2022-10-01 DIAGNOSIS — D239 Other benign neoplasm of skin, unspecified: Secondary | ICD-10-CM

## 2022-10-01 DIAGNOSIS — Z86018 Personal history of other benign neoplasm: Secondary | ICD-10-CM

## 2022-10-01 DIAGNOSIS — D2371 Other benign neoplasm of skin of right lower limb, including hip: Secondary | ICD-10-CM

## 2022-10-01 DIAGNOSIS — Z1283 Encounter for screening for malignant neoplasm of skin: Secondary | ICD-10-CM

## 2022-10-01 DIAGNOSIS — W908XXA Exposure to other nonionizing radiation, initial encounter: Secondary | ICD-10-CM

## 2022-10-01 MED ORDER — KETOCONAZOLE 2 % EX SHAM: MEDICATED_SHAMPOO | CUTANEOUS | 11 refills | Status: DC

## 2022-10-01 NOTE — Progress Notes (Addendum)
Follow-Up Visit   Subjective  Ariel Thompson is a 32 y.o. female who presents for the following: Skin Cancer Screening and Full Body Skin Exam  The patient presents for Total-Body Skin Exam (TBSE) for skin cancer screening and mole check. The patient has spots, moles and lesions to be evaluated, some may be new or changing and the patient may have concern these could be cancer.  Patient with hx dysplastic nevi.   Uses ketoconazole shampoo for seb derm, controlled with occasional itch and mild scaling.  The following portions of the chart were reviewed this encounter and updated as appropriate: medications, allergies, medical history  Review of Systems:  No other skin or systemic complaints except as noted in HPI or Assessment and Plan.  Objective  Well appearing patient in no apparent distress; mood and affect are within normal limits.  A full examination was performed including scalp, head, eyes, ears, nose, lips, neck, chest, axillae, abdomen, back, buttocks, bilateral upper extremities, bilateral lower extremities, hands, feet, fingers, toes, fingernails, and toenails. All findings within normal limits unless otherwise noted below.   Relevant physical exam findings are noted in the Assessment and Plan. Exam of nails limited by presence of nail polish.   Assessment & Plan   SKIN CANCER SCREENING PERFORMED TODAY.  Exam of nails limited by presence of nail polish.  ACTINIC DAMAGE - Chronic condition, secondary to cumulative UV/sun exposure - diffuse scaly erythematous macules with underlying dyspigmentation - Recommend daily broad spectrum sunscreen SPF 30+ to sun-exposed areas, reapply every 2 hours as needed.  - Staying in the shade or wearing long sleeves, sun glasses (UVA+UVB protection) and wide brim hats (4-inch brim around the entire circumference of the hat) are also recommended for sun protection.  - Call for new or changing lesions.  LENTIGINES - Benign normal  skin lesions - Benign-appearing - Call for any changes  MELANOCYTIC NEVI - Tan-brown and/or pink-flesh-colored symmetric macules and papules - Benign appearing on exam today - Observation - Call clinic for new or changing moles - Recommend daily use of broad spectrum spf 30+ sunscreen to sun-exposed areas.   History of Dysplastic Nevi - No evidence of recurrence today at left thigh, mild 2011 - Recommend regular full body skin exams - Recommend daily broad spectrum sunscreen SPF 30+ to sun-exposed areas, reapply every 2 hours as needed.  - Call if any new or changing lesions are noted between office visits  SEBORRHEIC DERMATITIS Exam: Pink patches with greasy scale at occipital scalp. Diffuse scaling of scalp  Chronic and persistent condition with duration or expected duration over one year. Condition is symptomatic/ bothersome to patient. Not currently at goal.   Seborrheic Dermatitis is a chronic persistent rash characterized by pinkness and scaling most commonly of the mid face but also can occur on the scalp (dandruff), ears; mid chest, mid back and groin.  It tends to be exacerbated by stress and cooler weather.  People who have neurologic disease may experience new onset or exacerbation of existing seborrheic dermatitis.  The condition is not curable but treatable and can be controlled.  Treatment Plan: Continue ketoconazole 2% shampoo apply three times per week, massage into scalp and leave in for 10 minutes before rinsing out Patient defers topical steroid today and will let us know if she would like [place] to send in clobetasol solution to use 1-2 times daily as needed for itch.  Consider Zoryve foam.   DERMATOFIBROMA Exam: Firm pink/brown papulenodule with dimple sign at  right posterior thigh. Treatment Plan: A dermatofibroma is a benign growth possibly related to trauma, such as an insect bite, cut from shaving, or inflamed acne-type bump.  Treatment options to remove include  shave or excision with resulting scar and risk of recurrence.  Since benign-appearing and not bothersome, will observe for now.    Seborrheic dermatitis  Related Medications ketoconazole (NIZORAL) 2 % shampoo Shampoo into scalp let sit 5-10 minutes then wash out. Use 3d/wk.   Return in about 1 year (around 10/01/2023) for TBSE, Hx Dysplastic Nevi, Seb Derm.  Anise Salvo, RMA, am acting as scribe for Elie Goody, MD .   Documentation: I have reviewed the above documentation for accuracy and completeness, and I agree with the above.  Elie Goody, MD

## 2022-10-01 NOTE — Patient Instructions (Addendum)

## 2022-10-20 IMAGING — US US OB COMP +14 WK
1 series · 15 of 28 positions shown · non-contrast
Comparison: none

CLINICAL DATA: Pregnancy.  Assess fetal anatomy.

EXAM:
OBSTETRICAL ULTRASOUND >14 WKS

[Series 1: us ob comp + 14 wk · 84 acquisitions, 15 frames shown]
[im 1/84]
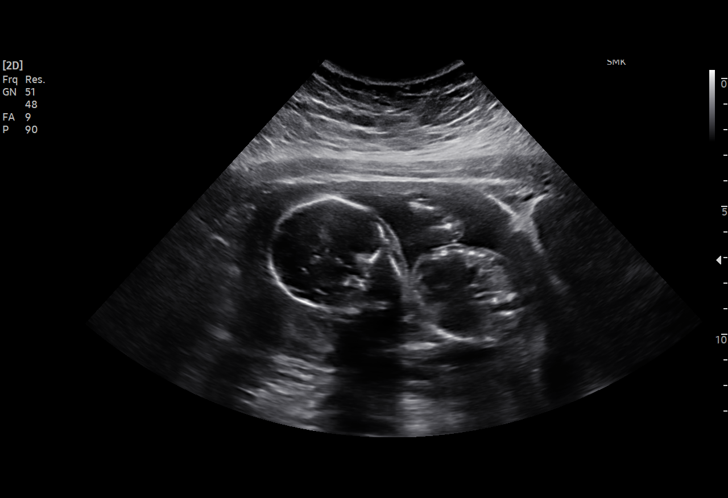
[im 7/84]
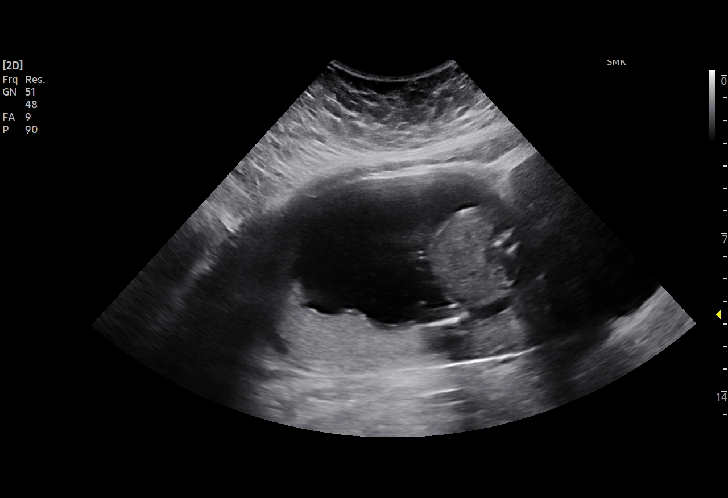
[im 13/84]
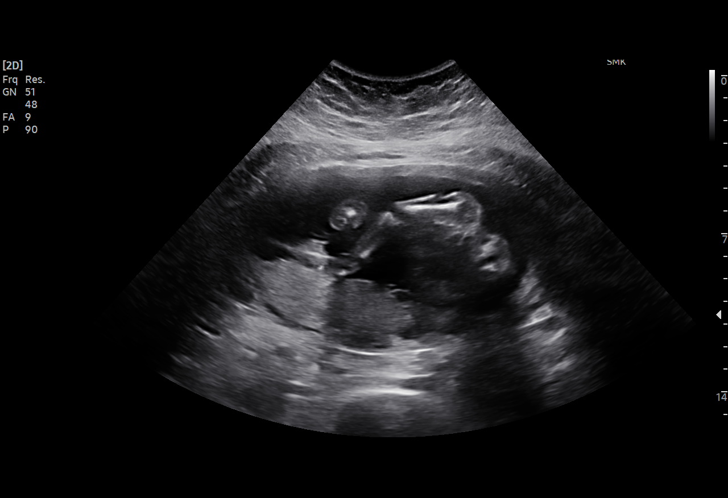
[im 19/84]
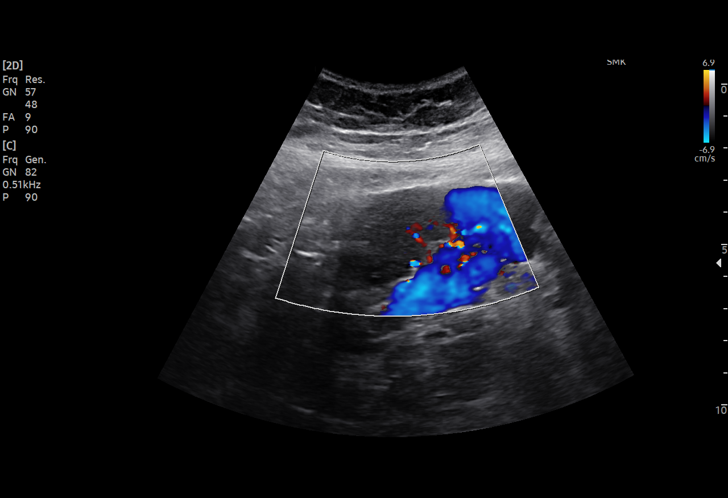
[im 25/84]
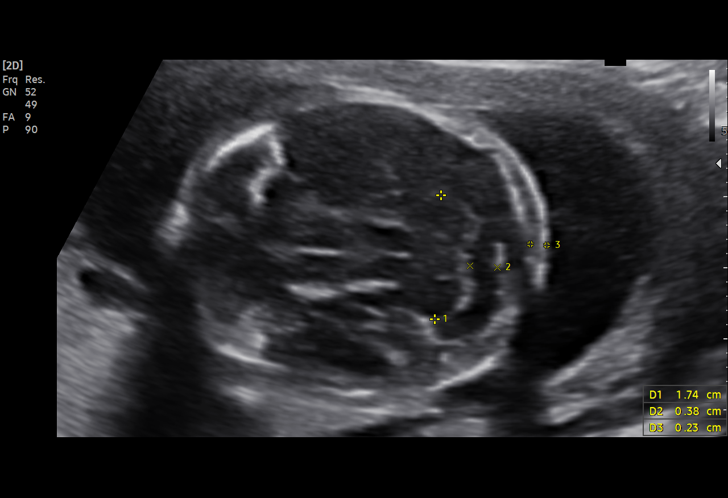
[im 31/84]
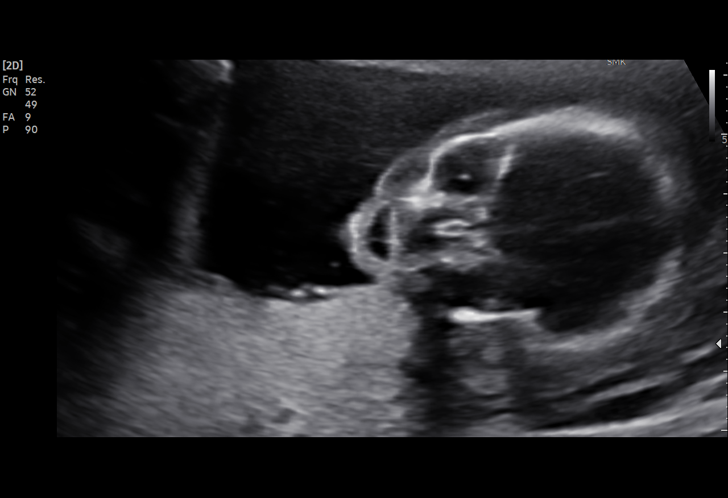
[im 37/84]
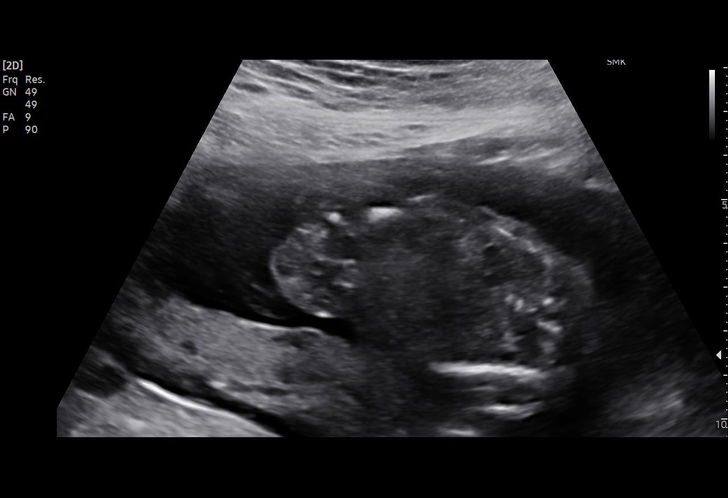
[im 44/84]
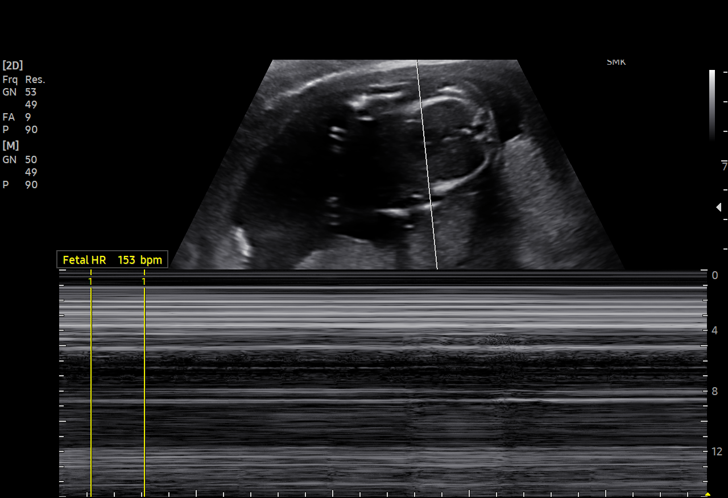
[im 47/84]
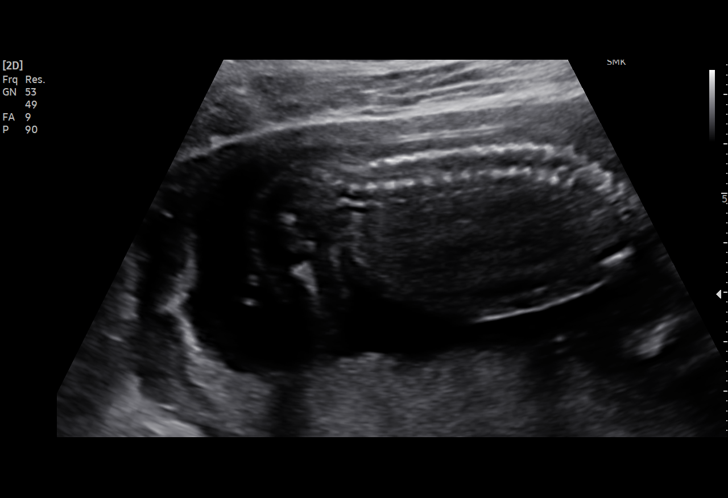
[im 53/84]
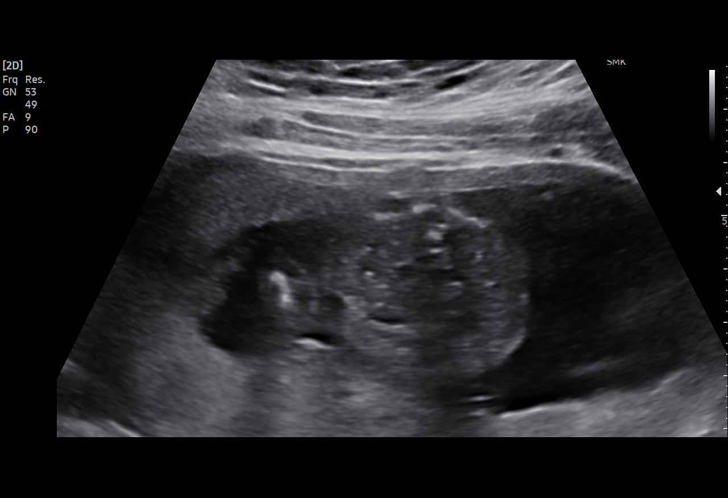
[im 59/84]
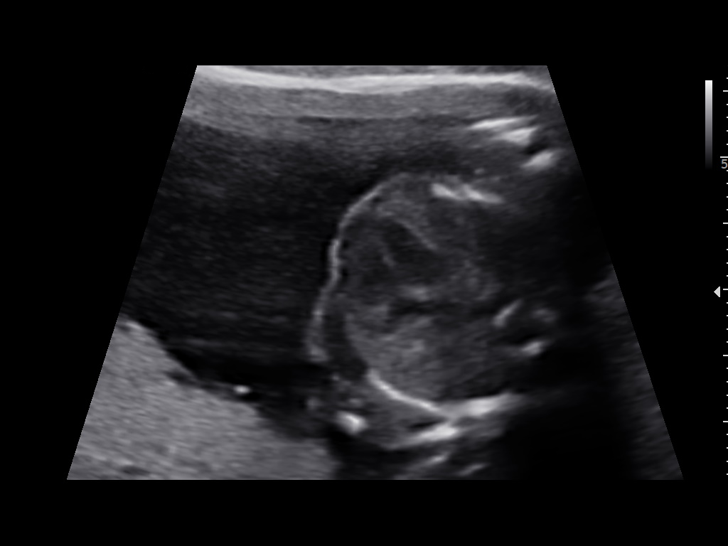
[im 65/84]
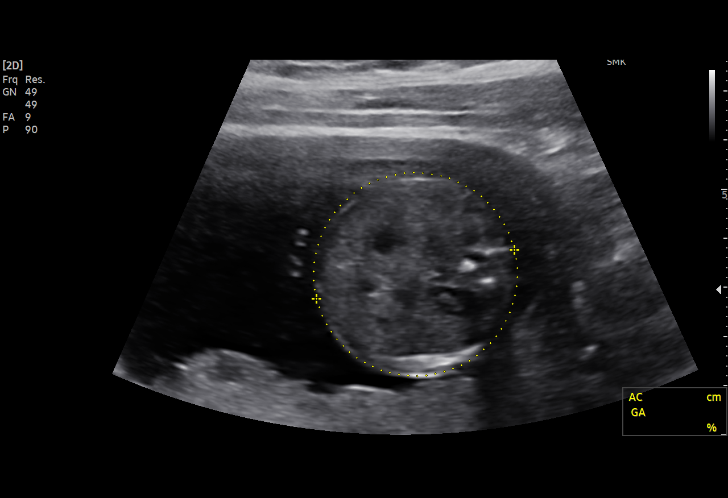
[im 71/84]
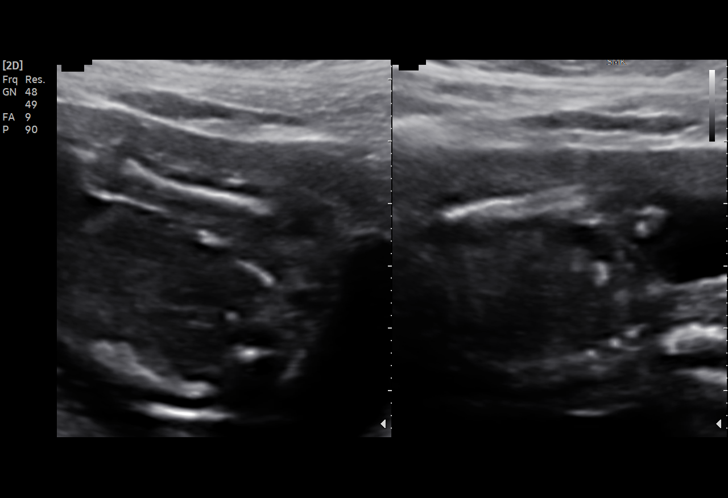
[im 77/84]
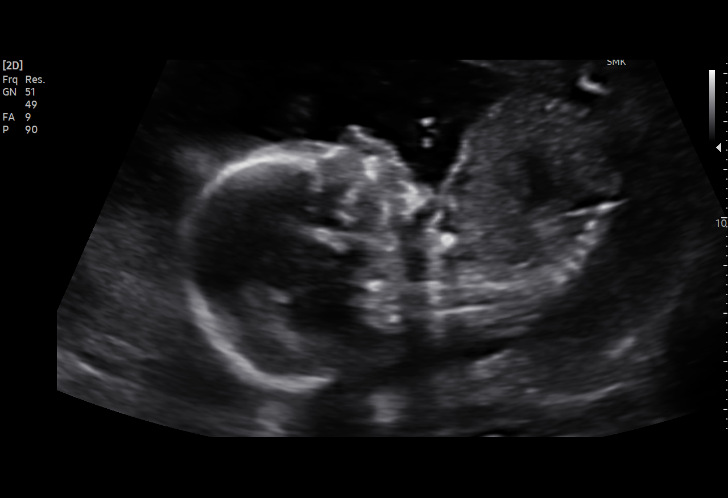
[im 84/84]
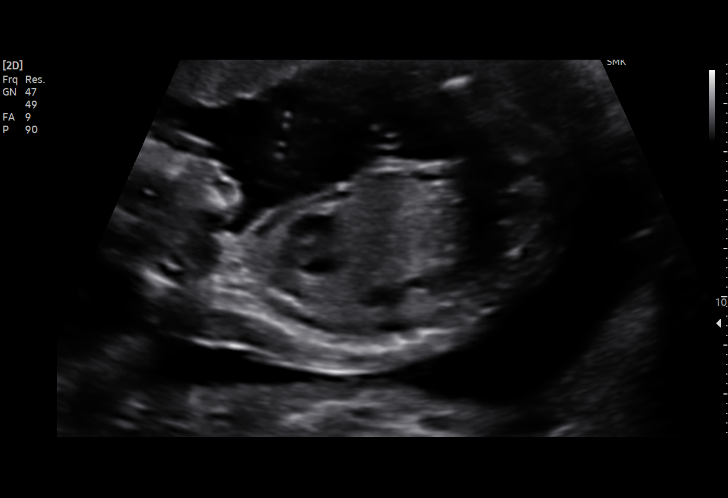

[15 of 28 positions shown; findings below may reference images not displayed]

FINDINGS: Number of Fetuses: 1

Heart Rate:  153 bpm

Movement: Yes

Presentation: Breech-variable

Previa: No

Placental Location: Posterior

Amniotic Fluid (Subjective): Normal

Amniotic Fluid (Objective):

Vertical pocket = 4.7cm

FETAL BIOMETRY

BPD: 4.3cm 19w 1d

HC:   15.6cm 18w 4d

AC:   13.0cm 18w 4d

FL:   2.5cm 17w 3d

Current Mean GA: 18w 3d US EDC: 12/22/2020

Assigned GA:  18w 4d Assigned EDC: 12/21/2020

Estimated Fetal Weight:  225g

FETAL ANATOMY

Lateral Ventricles: Appears normal

Thalami/CSP: Appears normal

Posterior Fossa:  Appears normal

Nuchal Region: Appears normal   NFT= 2.3 mm

Upper Lip: Appears normal

Spine: Appears normal

4 Chamber Heart on Left: Appears normal

LVOT: Appears normal

RVOT: Not visualized

Stomach on Left: Appears normal

3 Vessel Cord: Appears normal

Cord Insertion site: Appears normal

Kidneys: Appears normal

Bladder: Appears normal

Extremities: Appears normal

Technically difficult due to: Fetal positioning.

Maternal Findings:

Cervix:  Closed.  4.4 cm.
IMPRESSION: 1. Single live intrauterine gestation in variable presentation with
assigned gestational age of 18 weeks 4 days.
2. Limited visualization of the fetal right ventricular outflow
tract. Attention on follow-up.
3. Remainder of the fetal anatomic survey is within normal limits.
No fetal anomaly detected.

## 2022-11-19 LAB — OB RESULTS CONSOLE RPR: RPR: NONREACTIVE

## 2022-11-19 LAB — OB RESULTS CONSOLE HEPATITIS B SURFACE ANTIGEN: Hepatitis B Surface Ag: NEGATIVE

## 2022-11-19 LAB — OB RESULTS CONSOLE HIV ANTIBODY (ROUTINE TESTING): HIV: NONREACTIVE

## 2022-11-19 LAB — OB RESULTS CONSOLE RUBELLA ANTIBODY, IGM: Rubella: IMMUNE

## 2022-11-19 LAB — OB RESULTS CONSOLE ANTIBODY SCREEN: Antibody Screen: NEGATIVE

## 2022-11-19 LAB — HEPATITIS C ANTIBODY: HCV Ab: NEGATIVE

## 2022-11-27 LAB — OB RESULTS CONSOLE GC/CHLAMYDIA
Chlamydia: NEGATIVE
Neisseria Gonorrhea: NEGATIVE

## 2022-12-10 ENCOUNTER — Ambulatory Visit: Payer: BC Managed Care – PPO | Attending: Obstetrics & Gynecology | Admitting: Physical Therapy

## 2022-12-10 ENCOUNTER — Encounter: Payer: Self-pay | Admitting: Physical Therapy

## 2022-12-10 DIAGNOSIS — M533 Sacrococcygeal disorders, not elsewhere classified: Secondary | ICD-10-CM | POA: Insufficient documentation

## 2022-12-10 DIAGNOSIS — M5459 Other low back pain: Secondary | ICD-10-CM | POA: Insufficient documentation

## 2022-12-10 DIAGNOSIS — R2689 Other abnormalities of gait and mobility: Secondary | ICD-10-CM | POA: Diagnosis present

## 2022-12-10 NOTE — Patient Instructions (Addendum)
Avoid straining pelvic floor, abdominal muscles , spine  Use log rolling technique instead of getting out of bed with your neck or the sit-up     Log rolling into and out of bed   Log rolling into and out of bed If getting out of bed on R side, Bent knees, scoot hips/ shoulder to L  Raise R arm completely overhead, rolling onto armpit  Then lower bent knees to bed to get into complete side lying position  Then drop legs off bed, and push up onto R elbow/forearm, and use L hand to push onto the bed  __   Proper body mechanics with getting out of a chair to decrease strain  on back &pelvic floor   Avoid holding your breath when Getting out of the chair:  Scoot to front part of chair chair Heels behind knees, feet are hip width apart, nose over toes  Inhale like you are smelling roses Exhale to stand   __  Sitting with feet on ground, four points of contact Catch yourself crossing ankles and thighs  __ __  Practice not sleep on belly,  use pillows on trunk to feel the tactile input

## 2022-12-10 NOTE — Therapy (Unsigned)
OUTPATIENT PHYSICAL THERAPY EVALUATION   Patient Name: Ariel Thompson MRN: [id number] DOB:[birth year], 32 y.o., female Today's Date: 12/10/2022   PT End of Session - 12/10/22 1628     Visit Number 1    Number of Visits 10    Date for PT Re-Evaluation 02/18/23    PT Start Time 1630    PT Stop Time 1715    PT Time Calculation (min) 45 min    Activity Tolerance Patient tolerated treatment well;No increased pain    Behavior During Therapy WFL for tasks assessed/performed             Past Medical History:  Diagnosis Date   Depression with anxiety    Dysplastic nevus 08/02/2009   L thigh - mild   Past Surgical History:  Procedure Laterality Date   CHOLECYSTECTOMY N/A 01/18/2021   Procedure: LAPAROSCOPIC CHOLECYSTECTOMY WITH INTRAOPERATIVE CHOLANGIOGRAM;  Surgeon: Earline Mayotte, MD;  Location: [facility] ORS;  Service: General;  Laterality: N/A;   TONSILLECTOMY  2010   TYMPANOSTOMY TUBE PLACEMENT     Patient Active Problem List   Diagnosis Date Noted   Postpartum care following vaginal delivery 12/24/2020   Abdominal complaints 12/23/2020   Normal labor and delivery 12/23/2020   Uterine contractions 12/16/2020   Obesity affecting pregnancy 06/27/2020   Encounter for supervision of normal first pregnancy in third trimester 05/04/2020   BMI 34.0-34.9,adult 05/04/2020   Depression 05/04/2020   Anxiety 05/04/2020   Attention deficit hyperactivity disorder (ADHD), 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   Family planning 07/19/2015   Cyst of ovary 07/19/2015   Headache, migraine 12/18/2006   Allergic rhinitis 11/14/2005    PCP:  Julieanne Manson MD   REFERRING PROVIDER: Mitchel Honour, DO  REFERRING DIAG:  Stress Incontinence   Rationale for Evaluation and Treatment Rehabilitation  THERAPY DIAG:  Sacrococcygeal disorders, not elsewhere classified  Other abnormalities of gait and mobility  Other low back pain  ONSET DATE:   a few months after delivering first  child, gallbladder removed 3 weeks post partum   SUBJECTIVE:                                                                                                                                                                                           SUBJECTIVE STATEMENT:  Pt is currently [redacted] weeks pregnant with 2nd child.    Due date  06/16/23  1) fecal and urine leakage and vaginal flatulence:  these Sx occurred after pt delivered vaginally her first child Nov 2022. Pt also had gallbladder removed 3 weeks post partum which caused fecal leakage but probiotics have helped . Occasionally she has  fecal leakage if she is not able to make it to the bathroom in time. Urinary leakage with coughing , laughing, sneezing and with sit to stand after wiping.  Vaginal flatulence occurs when she is bending and when bladder is full   2) LBP: occurs when holding her dtr ( 24 lbs) > 30 min , sitting without back support > 30 min.  Worst 6-7/10, at best 2/10. Non radiating pain . Pt points to the SIJ central area.   Hx of sprained ankles B through sports and 2020  R ankle with 3 ligaments , wore a boot and went through PT.   Physical activities: walking 30 min , Pt had done sit up and crunches prior to pregnancy at group classes. Pt had tried classess one month after she was born but did not do crunches.    PERTINENT HISTORY:  PCOS, Hx of a ruptured ovary cyst R,  Pt is currently [redacted] weeks pregnant with 2nd child.  Due date 06/16/23  PAIN:  Are you having pain? Yes: above   PRECAUTIONS: pregnancy precautions    WEIGHT BEARING RESTRICTIONS: No  FALLS:  Has patient fallen in last 6 months? 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   PLOF: IND  PATIENT GOALS:  Decrease leakage and improve LBP during pregnancy    OBJECTIVE:   [facility] PT Assessment - 12/10/22 1658       Strength   Overall Strength Comments BLE 5/5       Palpation   SI assessment  L iliac crest higher than R, shoulder ,  L convex curve ,      Ambulation/Gait   Pre-Gait Activities 1.27 m/s, excessive sway to L             [facility] Adult PT Treatment/Exercise - 12/10/22 1704       Therapeutic Activites    Other Therapeutic Activities provided info for free doula services,  explained anatomy of deep core, body mechanics      Neuro Re-ed    Neuro Re-ed Details  cued for sitting and sit to stand mechanics to minimize straining pelvic floor                HOME EXERCISE PROGRAM: See pt instruction section    ASSESSMENT:  CLINICAL IMPRESSION: **   Pt benefits from skilled PT.    OBJECTIVE IMPAIRMENTS decreased activity tolerance, decreased coordination, decreased endurance, decreased mobility, difficulty walking, decreased ROM, decreased strength, decreased safety awareness, hypomobility, increased muscle spasms, impaired flexibility, improper body mechanics, postural dysfunction, and pain. scar restrictions   ACTIVITY LIMITATIONS  self-care,  sleep, home chores, work tasks    PARTICIPATION LIMITATIONS:  community, gym activities    PERSONAL FACTORS        are also affecting patient's functional outcome.    REHAB POTENTIAL: Good   CLINICAL DECISION MAKING: Evolving/moderate complexity   EVALUATION COMPLEXITY: Moderate    PATIENT EDUCATION:    Education details: Showed pt anatomy images. Explained muscles attachments/ connection, physiology of deep core system/ spinal- thoracic-pelvis-lower kinetic chain as they relate to pt's presentation, Sx, and past Hx. Explained what and how these areas of deficits need to be restored to balance and function    See Therapeutic activity / neuromuscular re-education section  Answered pt's questions.   Person educated: Patient Education method: Explanation, Demonstration, Tactile cues, Verbal cues, and Handouts  Education comprehension: verbalized understanding, returned  demonstration, verbal cues required, tactile cues required, and needs further education     PLAN: PT FREQUENCY: 1x/week   PT DURATION: 10 weeks   PLANNED INTERVENTIONS: Therapeutic exercises, Therapeutic activity, Neuromuscular re-education, Balance training, Gait training, Patient/Family education, Self Care, Joint mobilization, Spinal mobilization, Moist heat, Taping, and Manual therapy, dry needling.   PLAN FOR NEXT SESSION: See clinical impression for plan     GOALS: Goals reviewed with patient? Yes  SHORT TERM GOALS: Target date: 01/07/2023    Pt will demo IND with HEP                    Baseline: Not IND            Goal status: INITIAL   LONG TERM GOALS: Target date: 02/18/2023    1.Pt will demo proper deep core coordination without chest breathing and optimal excursion of diaphragm/pelvic floor in order to promote spinal stability and pelvic floor function  Baseline: dyscoordination Goal status: INITIAL  2.  Pt will demo > 5 pt change on FOTO  to improve QOL and function  PFDI Urinary baseline -  Lower score = better function  Urinary Problem baseline-   Higher score = better function  Pelvic Pain baseline - Lower score = better function  Bowel  constipation baseline -   Higher score = better function  PFDI Bowel - Higher score = better function  Lumber baseline  -  Higher score = better function   Goal status: INITIAL  3.  Pt will demo proper body mechanics in against gravity tasks and ADLs  work tasks, fitness  to minimize straining pelvic floor / back    Baseline: not IND, improper form that places strain on pelvic floor  Goal status: INITIAL    4. Pt will demo increased gait speed > 1.5 m/s with reciprocal gait pattern, longer stride length  in order to ambulate safely in community and return to fitness routine  Baseline: 1.27 m/s, excessive sway to L  Goal status: INITIAL    5. Pt will report no leakage with coughing , laughing, sneezing  and with sit to stand after wiping.   Baseline: Urinary leakage with coughing , laughing, sneezing and with sit to stand after wiping.   Goal status: INITIAL   6. Pt will be able to sit for > 30 min without back support in a chair and on the floor in order to attend activities for dtr Baseline: sitting without back support > 30 min,   Goal status: INITIAL  7. Pt will be able to bend / have a full bladder  and not have vaginal flatulence across one week  Baseline: Vaginal flatulence occurs when she is bending and when bladder is full  Goal status: INITIAL   8. Pt will be able to sleep on her back in her 1st  trimester and not sleep on her belly, sleep on her side during 2nd and 3rd trimester. not sleep on her belly, Baseline: On belly   Goal Status: INITIAL

## 2022-12-17 ENCOUNTER — Ambulatory Visit: Payer: BC Managed Care – PPO | Admitting: Physical Therapy

## 2022-12-17 DIAGNOSIS — M533 Sacrococcygeal disorders, not elsewhere classified: Secondary | ICD-10-CM

## 2022-12-17 DIAGNOSIS — M5459 Other low back pain: Secondary | ICD-10-CM

## 2022-12-17 DIAGNOSIS — R2689 Other abnormalities of gait and mobility: Secondary | ICD-10-CM

## 2022-12-17 NOTE — Therapy (Signed)
OUTPATIENT PHYSICAL THERAPY TREATMENT   Patient Name: Ariel Thompson MRN: [id number] DOB:10-Jul-1990, 32 y.o., female Today's Date: 12/17/2022   PT End of Session - 12/17/22 1640     Visit Number 2    Number of Visits 10    Date for PT Re-Evaluation 02/18/23    PT Start Time 1638    PT Stop Time 1718    PT Time Calculation (min) 40 min    Activity Tolerance Patient tolerated treatment well;No increased pain    Behavior During Therapy WFL for tasks assessed/performed             Past Medical History:  Diagnosis Date   Depression with anxiety    Dysplastic nevus 08/02/2009   L thigh - mild   Past Surgical History:  Procedure Laterality Date   CHOLECYSTECTOMY N/A 01/18/2021   Procedure: LAPAROSCOPIC CHOLECYSTECTOMY WITH INTRAOPERATIVE CHOLANGIOGRAM;  Surgeon: Earline Mayotte, MD;  Location: [facility] ORS;  Service: General;  Laterality: N/A;   TONSILLECTOMY  2010   TYMPANOSTOMY TUBE PLACEMENT     Patient Active Problem List   Diagnosis Date Noted   Postpartum care following vaginal delivery 12/24/2020   Abdominal complaints 12/23/2020   Normal labor and delivery 12/23/2020   Uterine contractions 12/16/2020   Obesity affecting pregnancy 06/27/2020   Encounter for supervision of normal first pregnancy in third trimester 05/04/2020   BMI 34.0-34.9,adult 05/04/2020   Depression 05/04/2020   Anxiety 05/04/2020   Attention deficit hyperactivity disorder (ADHD), 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   Family planning 07/19/2015   Cyst of ovary 07/19/2015   Headache, migraine 12/18/2006   Allergic rhinitis 11/14/2005    PCP:  Julieanne Manson MD   REFERRING PROVIDER: Mitchel Honour, DO  REFERRING DIAG:  Stress Incontinence   Rationale for Evaluation and Treatment Rehabilitation  THERAPY DIAG:  Sacrococcygeal disorders, not elsewhere classified  Other abnormalities of gait and mobility  Other low back pain  ONSET DATE:   a few months after delivering first  child, gallbladder removed 3 weeks post partum   SUBJECTIVE:            SUBJECTIVE STATEMENT TODAY: Pt has tried to avoid sleeping on her belly.  Pt held her dtr 45 min -1 hr and felt only twinges of LBP.                                                                                                                                                                               SUBJECTIVE STATEMENT ON EVAL 12/10/22:  Pt is currently [redacted] weeks pregnant with 2nd child.    Due date  06/16/23  1) fecal and urine leakage and vaginal flatulence:  these Sx occurred after  pt delivered vaginally her first child Nov 2022. Pt also had gallbladder removed 3 weeks post partum which caused fecal leakage but probiotics have helped . Occasionally she has fecal leakage if she is not able to make it to the bathroom in time. Urinary leakage with coughing , laughing, sneezing and with sit to stand after wiping.  Vaginal flatulence occurs when she is bending and when bladder is full   2) LBP: occurs when holding her dtr ( 24 lbs) > 30 min , sitting without back support > 30 min.  Worst 6-7/10, at best 2/10. Non radiating pain . Pt points to the SIJ central area.   Hx of sprained ankles B through sports and 2020  R ankle with 3 ligaments , wore a boot and went through PT.   Physical activities: walking 30 min , Pt had done sit up and crunches prior to pregnancy at group classes. Pt had tried classess one month after daughter was born but did not do crunches.    PERTINENT HISTORY:  PCOS, Hx of a ruptured ovary cyst R,  Pt is currently [redacted] weeks pregnant with 2nd child.  Due date 06/16/23  PAIN:  Are you having pain? Yes: above   PRECAUTIONS: pregnancy precautions    WEIGHT BEARING RESTRICTIONS: No  FALLS:  Has patient fallen in last 6 months? 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      Strength   Overall Strength Comments unilat support ,  PF MMT 4/5, 22 reps L, 25 reps R   hip abduction 3/5 B     Palpation   SI assessment  levelled pelvis and shoulders             [facility] Adult PT Treatment/Exercise - 12/17/22 1708       Therapeutic Activites    Other Therapeutic Activities explained the importance of LKC deficits getting addressed given Hx of anke sprains and will help with pelvic stability and LBP and pregnancy related Sx ie. swollen ankles,      Neuro Re-ed    Neuro Re-ed Details  cued for feet mobility HEP and hip abd strengthening and stretches      Manual Therapy   Manual therapy comments withholding from manual Tx to feet due pt being pregnant [redacted] weeks               HOME EXERCISE PROGRAM: See pt instruction section    ASSESSMENT:  CLINICAL IMPRESSION:   Improvements across past week:   Pt has tried to avoid sleeping on her belly.  Pt held her dtr 45 min -1 hr and felt only twinges of LBP.  Pt demo'd levelled pelvis and spine this week  Today,assessed LKC due to Hx of sprained ankles B through sports and 2020  R ankle with 3 ligaments. Pt showed hallus valgus on R > L.  Withholding from manual Tx to feet due to being pregnant [redacted] weeks   Added hip abduction strengthening due to weakness B.  Cued for technique with co activation of deep system with clamshell.  Explained the importance of LKC deficits getting addressed given Hx of anke sprains and will help with pelvic stability and LBP and pregnancy related Sx ie. swollen ankles.   Provided mobility HEP for toe abduction and ext to lift arches  and ER of knee with proprioception trianing and excessive cues tactile and verbal.   Plan to add deep core HEP promote optimize IAP system for improved pelvic floor function, trunk stability, gait, balance, stabilization with mobility tasks.    Plan to address pelvic floor issues once pelvis and spine are realigned to yield better outcomes.   Pt benefits from skilled PT.    OBJECTIVE IMPAIRMENTS decreased activity tolerance, decreased coordination, decreased endurance, decreased mobility, difficulty walking, decreased ROM, decreased strength, decreased safety awareness, hypomobility, increased muscle spasms, impaired flexibility, improper body mechanics, postural dysfunction, and pain. scar restrictions   ACTIVITY LIMITATIONS  self-care,  sleep, home chores, work tasks    PARTICIPATION LIMITATIONS:  community, gym activities    PERSONAL FACTORS        are also affecting patient's functional outcome.    REHAB POTENTIAL: Good   CLINICAL DECISION MAKING: Evolving/moderate complexity   EVALUATION COMPLEXITY: Moderate    PATIENT EDUCATION:    Education details: Showed pt anatomy images. Explained muscles attachments/ connection, physiology of deep core system/ spinal- thoracic-pelvis-lower kinetic chain as they relate to pt's presentation, Sx, and past Hx. Explained what and how these areas of deficits need to be restored to balance and function    See Therapeutic activity / neuromuscular re-education section  Answered pt's questions.   Person educated: Patient Education method: Explanation, Demonstration, Tactile cues, Verbal cues, and Handouts Education comprehension: verbalized understanding, returned demonstration, verbal cues required, tactile cues required, and needs further education     PLAN: PT FREQUENCY: 1x/week   PT DURATION: 10 weeks   PLANNED INTERVENTIONS: Therapeutic exercises, Therapeutic activity, Neuromuscular re-education, Balance training, Gait training,  Patient/Family education, Self Care, Joint mobilization, Spinal mobilization, Moist heat, Taping, and Manual therapy, dry needling.   PLAN FOR NEXT SESSION: See clinical impression for plan     GOALS: Goals reviewed with patient? Yes  SHORT TERM GOALS: Target date: 01/07/2023    Pt will demo IND with HEP                    Baseline: Not IND            Goal status: INITIAL   LONG TERM GOALS: Target date: 02/18/2023    1.Pt will demo proper deep core coordination without chest breathing and optimal excursion of diaphragm/pelvic floor in order to promote spinal stability and pelvic floor function  Baseline: dyscoordination Goal status: INITIAL  2.  Pt will demo > 5 pt change on FOTO  to improve QOL and function   Urinary Problem baseline- 57   Higher score = better function   Bowel  constipation baseline - 64   Higher score = better function   Lumber baseline  - 84 Higher score = better function   Goal status: INITIAL  3.  Pt will demo proper body mechanics in against gravity tasks and  ADLs  work tasks, fitness  to minimize straining pelvic floor / back    Baseline: not IND, improper form that places strain on pelvic floor  Goal status: INITIAL    4. Pt will demo increased gait speed > 1.5 m/s with reciprocal gait pattern, longer stride length  in order to ambulate safely in community and return to fitness routine  Baseline: 1.27 m/s, excessive sway to L  Goal status: INITIAL   5. Pt will report no leakage with coughing , laughing, sneezing and with sit to stand after wiping.   Baseline: Urinary leakage with coughing , laughing, sneezing and with sit to stand after wiping.   Goal status: INITIAL   6. Pt will be able to sit for > 30 min without back support in a chair and on the floor in order to attend activities for dtr Baseline: sitting without back support > 30 min,   Goal status: INITIAL  7. Pt will be able to bend / have a full bladder  and not have  vaginal flatulence across one week  Baseline: Vaginal flatulence occurs when she is bending and when bladder is full  Goal status: INITIAL   8. Pt will be able to sleep on her back in her 1st  trimester and not sleep on her belly, sleep on her side during 2nd and 3rd trimester. not sleep on her belly, Baseline: On belly   Goal Status: INITIAL   9.Pt will demo levelled pelvic girdle and shoulder height in order to progress to deep core strengthening HEP and restore mobility at spine, pelvis, gait, posture minimize falls, and improve balance  Baseline: L iliac crest higher than R, shoulder ,  L convex curve ,  Goal Status: INITIAL

## 2022-12-17 NOTE — Patient Instructions (Signed)
  Avoid straining pelvic floor, abdominal muscles , spine  Use log rolling technique instead of getting out of bed with your neck or the sit-up     Log rolling into and out of bed   Log rolling into and out of bed If getting out of bed on R side, Bent knees, scoot hips/ shoulder to L  Raise R arm completely overhead, rolling onto armpit  Then lower bent knees to bed to get into complete side lying position  Then drop legs off bed, and push up onto R elbow/forearm, and use L hand to push onto the bed    Dig elbows and feet to lift hte buttocks and scoot without lifting head  __    Clam Shell 45 Degrees  Lying with hips and knees bent 45, one pillow between knees and ankles. Heel together, toes apart like ballerina,  Lift knee with exhale while pressing heels together. Be sure pelvis does not roll backward. Do not arch back. Do 20 times, each leg, 2 times per day.    Complimentary stretch:    Sit at 45 deg turn with R leg and knee on edge of chair/ bench, L buttock hanging off the edge to bring the L foot back like a lunge, toes bent, lower heel to feel quad stretch,  pay attention to keeping pinky and first toe ballmound planted to align toes forward so ankles are not twerked   Repeat with other side   ___  Strengthening feet arches:    Feet slides :   Points of contact at sitting bones  Four points of contact of foot,  Side knee back while keeping knee out along 2-3rd toe line   Heel up, ankle not twist out Lower heel while keeping knee out along 2-3rd toe line Four points of contact of foot, Slide foot back while keeping knee out along 2-3rd toe line   Repeated with other foot   2 min  __

## 2022-12-24 ENCOUNTER — Ambulatory Visit: Payer: BC Managed Care – PPO | Attending: Obstetrics & Gynecology | Admitting: Physical Therapy

## 2022-12-24 DIAGNOSIS — R2689 Other abnormalities of gait and mobility: Secondary | ICD-10-CM | POA: Diagnosis present

## 2022-12-24 DIAGNOSIS — M533 Sacrococcygeal disorders, not elsewhere classified: Secondary | ICD-10-CM | POA: Diagnosis present

## 2022-12-24 DIAGNOSIS — M5459 Other low back pain: Secondary | ICD-10-CM | POA: Diagnosis present

## 2022-12-24 NOTE — Patient Instructions (Signed)
Feet care :  Self -feet massage   Handshake : fingers between toes, moving ballmounds/toes back and forth several times while other hand anchors at arch. Do the same at the hind/mid foot.  Heel to toes upward to a letter Big Letter T strokes to spread ballmounds and toes, several times, pinch between webs of toes  Run finger tips along top of foot between long bones "comb between the bones"    Wiggle toes and spread them out when relaxing   ___  Strengthening feet arches:     Feet slides :   Points of contact at sitting bones  Four points of contact of foot,  Side knee back while keeping knee out along 2-3rd toe line   Heel up, ankle not twist out Lower heel while keeping knee out along 2-3rd toe line Four points of contact of foot, Slide foot back while keeping knee out along 2-3rd toe line   Repeated with other foot     3 min( plastic bags to slide easier)   __  Strengthening feet arches:    Heel raises - heels together, minisquat  SEATED   Minisquat motion, trunk bent , gaze onto floor like you are looking at your reflection over a lake/pond,  Knees bent pointed out like a "v" , navel ( center of mass) more forward  Heels together as you lift, pointed out like a "v"  KNEES ARE ALIGNED BEHIND THE TOES TO MINIMIZE STRAIN ON THE KNEES your  navel ( center of mass) more forward to a avoid dropping down fast and rocking more weight back onto heels , keep heels pressing against each other the whole time   COMBINED WITH chin tuck, shoulders down, hands press to seat, thumb forward  30 reps  _________

## 2022-12-24 NOTE — Therapy (Signed)
OUTPATIENT PHYSICAL THERAPY TREATMENT   Patient Name: Ariel Thompson MRN: [id number] DOB:[birth year], 32 y.o., female Today's Date: 12/24/2022   PT End of Session - 12/24/22 1638     Visit Number 3    Number of Visits 10    Date for PT Re-Evaluation 02/18/23    PT Start Time 1633    PT Stop Time 1715    PT Time Calculation (min) 42 min    Activity Tolerance Patient tolerated treatment well;No increased pain    Behavior During Therapy WFL for tasks assessed/performed             Past Medical History:  Diagnosis Date   Depression with anxiety    Dysplastic nevus 08/02/2009   L thigh - mild   Past Surgical History:  Procedure Laterality Date   CHOLECYSTECTOMY N/A 01/18/2021   Procedure: LAPAROSCOPIC CHOLECYSTECTOMY WITH INTRAOPERATIVE CHOLANGIOGRAM;  Surgeon: Earline Mayotte, MD;  Location: [facility] OR[facility];  Service: General;  Laterality: N/A;   TONSILLECTOMY  2010   TYMPANOSTOMY TUBE PLACEMENT     Patient Active Problem List   Diagnosis Date Noted   Postpartum care following vaginal delivery 12/24/2020   Abdominal complaints 12/23/2020   Normal labor and delivery 12/23/2020   Uterine contractions 12/16/2020   Obesity affecting pregnancy 06/27/2020   Encounter for supervision of normal first pregnancy in third trimester 05/04/2020   BMI 34.0-34.9,adult 05/04/2020   Depression 05/04/2020   Anxiety 05/04/2020   Attention deficit hyperactivity disorder (ADHD), 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   Family planning 07/19/2015   Cyst of ovary 07/19/2015   Headache, migraine 12/18/2006   Allergic rhinitis 11/14/2005    PCP:  Julieanne Manson MD   REFERRING PROVIDER: Mitchel Honour, DO  REFERRING DIAG:  Stress Incontinence   Rationale for Evaluation and Treatment Rehabilitation  THERAPY DIAG:  Sacrococcygeal disorders, not elsewhere classified  Other low back pain  Other abnormalities of gait and mobility  ONSET DATE:   a few months after delivering first  child, gallbladder removed 3 weeks post partum   SUBJECTIVE:            SUBJECTIVE STATEMENT TODAY: Pt has been trying this past week not walking heel to toe                                                                                                       SUBJECTIVE STATEMENT ON EVAL 12/10/22:  Pt is currently [redacted] weeks pregnant with 2nd child.    Due date  06/16/23  1) fecal and urine leakage and vaginal flatulence:  these Sx occurred after pt delivered vaginally her first child Nov 2022. Pt also had gallbladder removed 3 weeks post partum which caused fecal leakage but probiotics have helped . Occasionally she has fecal leakage if she is not able to make it to the bathroom in time. Urinary leakage with coughing , laughing, sneezing and with sit to stand after wiping.  Vaginal flatulence occurs when she is bending and when bladder is full   2) LBP: occurs when holding her dtr ( 24 lbs) >  30 min , sitting without back support > 30 min.  Worst 6-7/10, at best 2/10. Non radiating pain . Pt points to the SIJ central area.   Hx of sprained ankles B through sports and 2020  R ankle with 3 ligaments , wore a boot and went through PT.   Physical activities: walking 30 min , Pt had done sit up and crunches prior to pregnancy at group classes. Pt had tried classess one month after daughter was born but did not do crunches.    PERTINENT HISTORY:  PCOS, Hx of a ruptured ovary cyst R,  Pt is currently [redacted] weeks pregnant with 2nd child.  Due date 06/16/23  PAIN:  Are you having pain? Yes: above   PRECAUTIONS: pregnancy precautions    WEIGHT BEARING RESTRICTIONS: No  FALLS:  Has patient fallen in last 6 months? 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   PLOF: IND  PATIENT GOALS:  Decrease leakage and improve LBP during pregnancy    OBJECTIVE:    [facility] PT Assessment - 12/24/22 1705        Observation/Other Assessments   Observations toe scrunching, supination in new LKC HEP             [facility] Adult PT Treatment/Exercise - 12/24/22 1706       Therapeutic Activites    Other Therapeutic Activities explained to buy new shoes and with more feet focused sessions, pt will no longer need orthotic which are contributing to gait deviations ( supination, knee valgus, overactivity of pelvic floor)      Neuro Re-ed    Neuro Re-ed Details  cued for heel slides for optimal DF/EV, toe abduction , downgrade to seated position due to nerve pain in L foot , cued for cervicoscapular strengthening in combination with seated version of LKC exericse for foot arch strenghtening, cued for feet massage              HOME EXERCISE PROGRAM: See pt instruction section    ASSESSMENT:  CLINICAL IMPRESSION:    Focused on LKC today to promote balance, minimize ankle sprains , feet pain.  Withholding manual Tx due to 1st trimest of pregnancy. Provided therapeutic exercise and lots of neuromuscular re-education to minimize supination, toe adduction to promote DF/EV, toe abduction and minimize risk for ankle sprains due to Hx of ankle sprain. Addressing LKC deficits will help pelvic floor function .    Explained to buy new shoes and with more feet focused sessions, pt will no longer need orthotic inserts which are contributing to gait deviations ( supination, knee valgus, overactivity of pelvic floor)  Cued for heel slides for optimal DF/EV, toe abduction , downgrade to seated position due to nerve pain in L foot , cued for cervicoscapular strengthening in combination with seated version of LKC exericse for foot arch strenghtening, cued for feet massage   Plan to add deep core HEP promote optimize IAP system for improved pelvic floor function, trunk stability, gait, balance, stabilization with mobility tasks.    Plan to address pelvic floor issues once pelvis and spine are realigned to yield better  outcomes.   Pt benefits from skilled PT.    OBJECTIVE IMPAIRMENTS decreased activity tolerance, decreased coordination, decreased endurance, decreased mobility, difficulty walking, decreased ROM, decreased strength, decreased safety awareness, hypomobility, increased muscle spasms, impaired flexibility, improper body mechanics, postural dysfunction, and pain.  scar restrictions   ACTIVITY LIMITATIONS  self-care,  sleep, home chores, work tasks    PARTICIPATION LIMITATIONS:  community, gym activities    PERSONAL FACTORS        are also affecting patient's functional outcome.    REHAB POTENTIAL: Good   CLINICAL DECISION MAKING: Evolving/moderate complexity   EVALUATION COMPLEXITY: Moderate    PATIENT EDUCATION:    Education details: Showed pt anatomy images. Explained muscles attachments/ connection, physiology of deep core system/ spinal- thoracic-pelvis-lower kinetic chain as they relate to pt's presentation, Sx, and past Hx. Explained what and how these areas of deficits need to be restored to balance and function    See Therapeutic activity / neuromuscular re-education section  Answered pt's questions.   Person educated: Patient Education method: Explanation, Demonstration, Tactile cues, Verbal cues, and Handouts Education comprehension: verbalized understanding, returned demonstration, verbal cues required, tactile cues required, and needs further education     PLAN: PT FREQUENCY: 1x/week   PT DURATION: 10 weeks   PLANNED INTERVENTIONS: Therapeutic exercises, Therapeutic activity, Neuromuscular re-education, Balance training, Gait training, Patient/Family education, Self Care, Joint mobilization, Spinal mobilization, Moist heat, Taping, and Manual therapy, dry needling.   PLAN FOR NEXT SESSION: See clinical impression for plan     GOALS: Goals reviewed with patient? Yes  SHORT TERM GOALS: Target date: 01/07/2023    Pt will demo IND with HEP                     Baseline: Not IND            Goal status: INITIAL   LONG TERM GOALS: Target date: 02/18/2023    1.Pt will demo proper deep core coordination without chest breathing and optimal excursion of diaphragm/pelvic floor in order to promote spinal stability and pelvic floor function  Baseline: dyscoordination Goal status: INITIAL  2.  Pt will demo > 5 pt change on FOTO  to improve QOL and function   Urinary Problem baseline- 57   Higher score = better function   Bowel  constipation baseline - 64   Higher score = better function   Lumber baseline  - 84 Higher score = better function   Goal status: INITIAL  3.  Pt will demo proper body mechanics in against gravity tasks and ADLs  work tasks, fitness  to minimize straining pelvic floor / back    Baseline: not IND, improper form that places strain on pelvic floor  Goal status: INITIAL    4. Pt will demo increased gait speed > 1.5 m/s with reciprocal gait pattern, longer stride length  in order to ambulate safely in community and return to fitness routine  Baseline: 1.27 m/s, excessive sway to L  Goal status: INITIAL   5. Pt will report no leakage with coughing , laughing, sneezing and with sit to stand after wiping.   Baseline: Urinary leakage with coughing , laughing, sneezing and with sit to stand after wiping.   Goal status: INITIAL   6. Pt will be able to sit for > 30 min without back support in a chair and on the floor in order to attend activities for dtr Baseline: sitting without back support > 30 min,   Goal status: INITIAL  7. Pt will be able to bend / have a full bladder  and not have vaginal flatulence across one week  Baseline: Vaginal flatulence occurs when she is bending and when bladder is full  Goal status: INITIAL   8. Pt  will be able to sleep on her back in her 1st  trimester and not sleep on her belly, sleep on her side during 2nd and 3rd trimester. not sleep on her belly, Baseline: On belly   Goal Status:  INITIAL   9.Pt will demo levelled pelvic girdle and shoulder height in order to progress to deep core strengthening HEP and restore mobility at spine, pelvis, gait, posture minimize falls, and improve balance  Baseline: L iliac crest higher than R, shoulder ,  L convex curve ,  Goal Status: MET 11/6 /24 Visit  2 and 3

## 2022-12-24 NOTE — Therapy (Deleted)
OUTPATIENT PHYSICAL THERAPY EVALUATION   Patient Name: Ariel Thompson MRN: [id number] DOB:1990-03-19, 32 y.o., female Today's Date: 12/24/2022   PT End of Session - 12/24/22 1638     Visit Number 3    Number of Visits 10    Date for PT Re-Evaluation 02/18/23    PT Start Time 1633    PT Stop Time 1715    PT Time Calculation (min) 42 min    Activity Tolerance Patient tolerated treatment well;No increased pain    Behavior During Therapy WFL for tasks assessed/performed             Past Medical History:  Diagnosis Date   Depression with anxiety    Dysplastic nevus 08/02/2009   L thigh - mild   Past Surgical History:  Procedure Laterality Date   CHOLECYSTECTOMY N/A 01/18/2021   Procedure: LAPAROSCOPIC CHOLECYSTECTOMY WITH INTRAOPERATIVE CHOLANGIOGRAM;  Surgeon: Earline Mayotte, MD;  Location: [facility] ORS;  Service: General;  Laterality: N/A;   TONSILLECTOMY  2010   TYMPANOSTOMY TUBE PLACEMENT     Patient Active Problem List   Diagnosis Date Noted   Postpartum care following vaginal delivery 12/24/2020   Abdominal complaints 12/23/2020   Normal labor and delivery 12/23/2020   Uterine contractions 12/16/2020   Obesity affecting pregnancy 06/27/2020   Encounter for supervision of normal first pregnancy in third trimester 05/04/2020   BMI 34.0-34.9,adult 05/04/2020   Depression 05/04/2020   Anxiety 05/04/2020   Attention deficit hyperactivity disorder (ADHD), 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   Family planning 07/19/2015   Cyst of ovary 07/19/2015   Headache, migraine 12/18/2006   Allergic rhinitis 11/14/2005    PCP:  Julieanne Manson MD   REFERRING PROVIDER: Mitchel Honour, DO  REFERRING DIAG:  Stress Incontinence   Rationale for Evaluation and Treatment Rehabilitation  THERAPY DIAG:  Sacrococcygeal disorders, not elsewhere classified  Other low back pain  Other abnormalities of gait and mobility  ONSET DATE:   a few months after delivering first  child, gallbladder removed 3 weeks post partum   SUBJECTIVE:                                                                                                                                                                                           SUBJECTIVE STATEMENT:  Pt is currently [redacted] weeks pregnant with 2nd child.    Due date  06/16/23  1) fecal and urine leakage and vaginal flatulence:  these Sx occurred after pt delivered vaginally her first child Nov 2022. Pt also had gallbladder removed 3 weeks post partum which caused fecal leakage but probiotics have helped . Occasionally she has  fecal leakage if she is not able to make it to the bathroom in time. Urinary leakage with coughing , laughing, sneezing and with sit to stand after wiping.  Vaginal flatulence occurs when she is bending and when bladder is full   2) LBP: occurs when holding her dtr ( 24 lbs) > 30 min , sitting without back support > 30 min.  Worst 6-7/10, at best 2/10. Non radiating pain . Pt points to the SIJ central area.   Hx of sprained ankles B through sports and 2020  R ankle with 3 ligaments , wore a boot and went through PT.   Physical activities: walking 30 min , Pt had done sit up and crunches prior to pregnancy at group classes. Pt had tried classess one month after daughter was born but did not do crunches.    PERTINENT HISTORY:  PCOS, Hx of a ruptured ovary cyst R,  Pt is currently [redacted] weeks pregnant with 2nd child.  Due date 06/16/23  PAIN:  Are you having pain? Yes: above   PRECAUTIONS: pregnancy precautions    WEIGHT BEARING RESTRICTIONS: No  FALLS:  Has patient fallen in last 6 months? 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   PLOF: IND  PATIENT GOALS:  Decrease leakage and improve LBP during pregnancy    OBJECTIVE:   [facility] PT Assessment - 12/10/22 1658       Strength   Overall Strength Comments BLE 5/5       Palpation   SI assessment  L iliac crest higher than R, shoulder ,  L convex curve ,      Ambulation/Gait   Pre-Gait Activities 1.27 m/s, excessive sway to L                  HOME EXERCISE PROGRAM: See pt instruction section    ASSESSMENT:  CLINICAL IMPRESSION:   Pt is a  32  yo  who is [redacted] weeks pregnant with 2nd child and presents with fecal and urine leakage and vaginal flatulence and LBP which impact QOL, ADL, fitness, and community activities.   Pt's musculoskeletal assessment revealed uneven pelvic girdle and shoulder height, asymmetries to gait pattern, limited spinal /pelvic mobility, dyscoordination and strength of pelvic floor mm, hip weakness, poor body mechanics which places strain on the abdominal/pelvic floor mm. These are deficits that indicate an ineffective intraabdominal pressure system associated with increased risk for pt's Sx.    Pt will benefit from coordination training and education on fitness and functional positions in order to gain a more effective intraabdominal pressure system to minimize fecal/ urinary leakage / vaginal flatulence / LBP.  Pt was provided education on etiology of Sx with anatomy, physiology explanation with images along with the benefits of customized pelvic PT Tx based on pt's medical conditions and musculoskeletal deficits.  Explained the physiology of deep core mm coordination and roles of pelvic floor function in urination, defecation, sexual function, and postural control with deep core mm system.   Regional interdependent approaches will yield greater benefits in pt's POC.  Following Tx today which pt tolerated without complaints,  pt demo'd proper body mechanics to minimize straining pelvic floor.  Plan to address realignment of spine/ pelvis at next session to help promote optimize IAP system for improved pelvic floor function, trunk stability, gait, balance, stabilization with mobility tasks.  Plan to address  pelvic floor  issues once pelvis and spine are realigned to yield better outcomes.   Pt benefits from skilled PT.    OBJECTIVE IMPAIRMENTS decreased activity tolerance, decreased coordination, decreased endurance, decreased mobility, difficulty walking, decreased ROM, decreased strength, decreased safety awareness, hypomobility, increased muscle spasms, impaired flexibility, improper body mechanics, postural dysfunction, and pain. scar restrictions   ACTIVITY LIMITATIONS  self-care,  sleep, home chores, work tasks    PARTICIPATION LIMITATIONS:  community, gym activities    PERSONAL FACTORS        are also affecting patient's functional outcome.    REHAB POTENTIAL: Good   CLINICAL DECISION MAKING: Evolving/moderate complexity   EVALUATION COMPLEXITY: Moderate    PATIENT EDUCATION:    Education details: Showed pt anatomy images. Explained muscles attachments/ connection, physiology of deep core system/ spinal- thoracic-pelvis-lower kinetic chain as they relate to pt's presentation, Sx, and past Hx. Explained what and how these areas of deficits need to be restored to balance and function    See Therapeutic activity / neuromuscular re-education section  Answered pt's questions.   Person educated: Patient Education method: Explanation, Demonstration, Tactile cues, Verbal cues, and Handouts Education comprehension: verbalized understanding, returned demonstration, verbal cues required, tactile cues required, and needs further education     PLAN: PT FREQUENCY: 1x/week   PT DURATION: 10 weeks   PLANNED INTERVENTIONS: Therapeutic exercises, Therapeutic activity, Neuromuscular re-education, Balance training, Gait training, Patient/Family education, Self Care, Joint mobilization, Spinal mobilization, Moist heat, Taping, and Manual therapy, dry needling.   PLAN FOR NEXT SESSION: See clinical impression for plan     GOALS: Goals reviewed with patient? Yes  SHORT TERM GOALS: Target date:  01/07/2023    Pt will demo IND with HEP                    Baseline: Not IND            Goal status: INITIAL   LONG TERM GOALS: Target date: 02/18/2023    1.Pt will demo proper deep core coordination without chest breathing and optimal excursion of diaphragm/pelvic floor in order to promote spinal stability and pelvic floor function  Baseline: dyscoordination Goal status: INITIAL  2.  Pt will demo > 5 pt change on FOTO  to improve QOL and function   Urinary Problem baseline- 57   Higher score = better function   Bowel  constipation baseline - 64   Higher score = better function   Lumber baseline  - 84 Higher score = better function   Goal status: INITIAL  3.  Pt will demo proper body mechanics in against gravity tasks and ADLs  work tasks, fitness  to minimize straining pelvic floor / back    Baseline: not IND, improper form that places strain on pelvic floor  Goal status: INITIAL    4. Pt will demo increased gait speed > 1.5 m/s with reciprocal gait pattern, longer stride length  in order to ambulate safely in community and return to fitness routine  Baseline: 1.27 m/s, excessive sway to L  Goal status: INITIAL   5. Pt will report no leakage with coughing , laughing, sneezing and with sit to stand after wiping.   Baseline: Urinary leakage with coughing , laughing, sneezing and with sit to stand after wiping.   Goal status: INITIAL   6. Pt will be able to sit for > 30 min without back support in a chair and on the floor in order to attend activities for dtr  Baseline: sitting without back support > 30 min,   Goal status: INITIAL  7. Pt will be able to bend / have a full bladder  and not have vaginal flatulence across one week  Baseline: Vaginal flatulence occurs when she is bending and when bladder is full  Goal status: INITIAL   8. Pt will be able to sleep on her back in her 1st  trimester and not sleep on her belly, sleep on her side during 2nd and 3rd  trimester. not sleep on her belly, Baseline: On belly   Goal Status: INITIAL   9.Pt will demo levelled pelvic girdle and shoulder height in order to progress to deep core strengthening HEP and restore mobility at spine, pelvis, gait, posture minimize falls, and improve balance  Baseline: L iliac crest higher than R, shoulder ,  L convex curve ,  Goal Status: INITIAL

## 2022-12-31 ENCOUNTER — Ambulatory Visit: Payer: BC Managed Care – PPO | Admitting: Physical Therapy

## 2023-01-01 ENCOUNTER — Ambulatory Visit: Payer: BC Managed Care – PPO | Admitting: Physical Therapy

## 2023-01-07 ENCOUNTER — Ambulatory Visit: Payer: BC Managed Care – PPO | Admitting: Physical Therapy

## 2023-01-14 ENCOUNTER — Ambulatory Visit: Payer: BC Managed Care – PPO | Admitting: Physical Therapy

## 2023-01-21 ENCOUNTER — Ambulatory Visit: Payer: BC Managed Care – PPO | Admitting: Physical Therapy

## 2023-01-28 ENCOUNTER — Ambulatory Visit: Payer: BC Managed Care – PPO | Admitting: Physical Therapy

## 2023-02-01 IMAGING — US US OB FOLLOW-UP
1 series · 15 of 24 positions shown · non-contrast
Comparison: none

CLINICAL DATA: 29-year-old pregnant female presents for evaluation
of fetal growth and AFI.

EXAM:
OBSTETRIC 14+ WK ULTRASOUND FOLLOW-UP

[Series 1: us ob follow up · 15 of 24 slices shown]
[im 1/24]
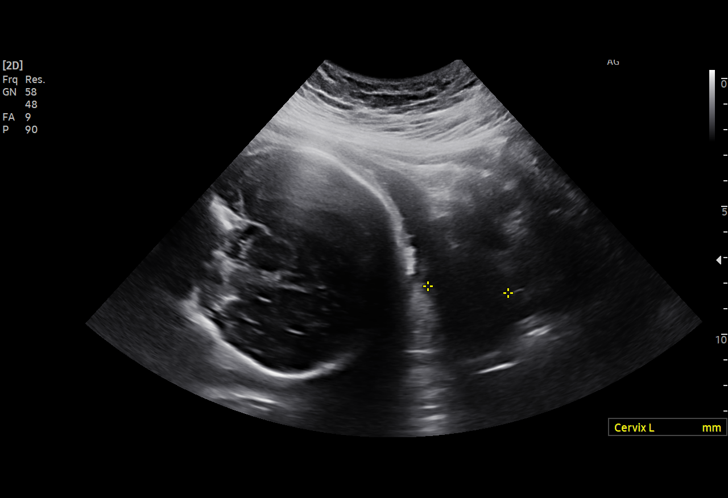
[im 3/24]
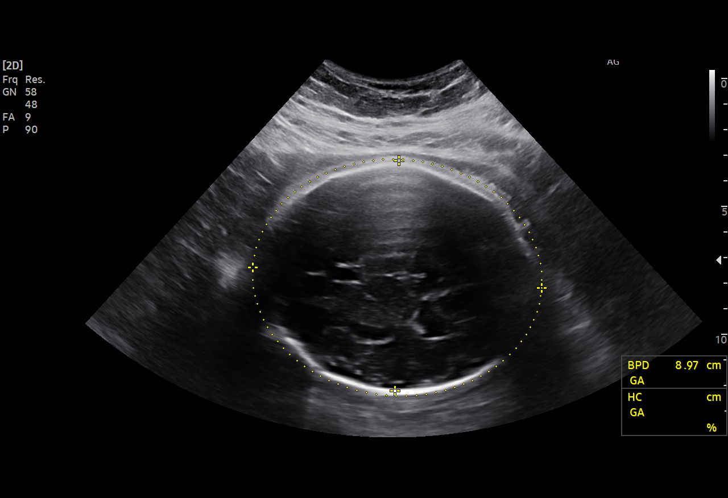
[im 5/24]
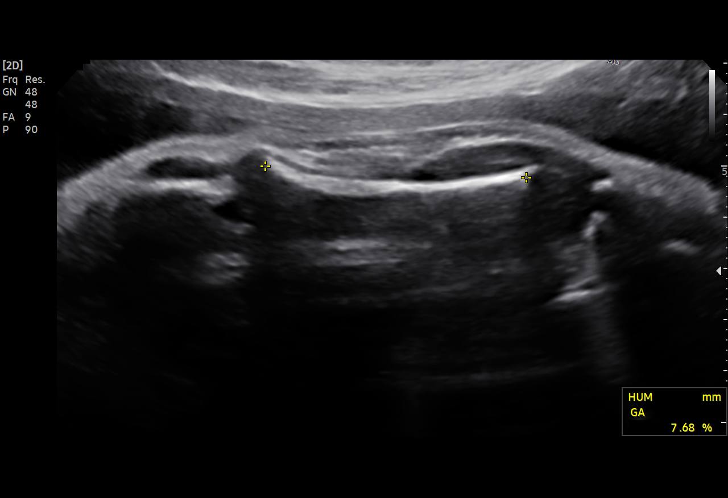
[im 6/24]
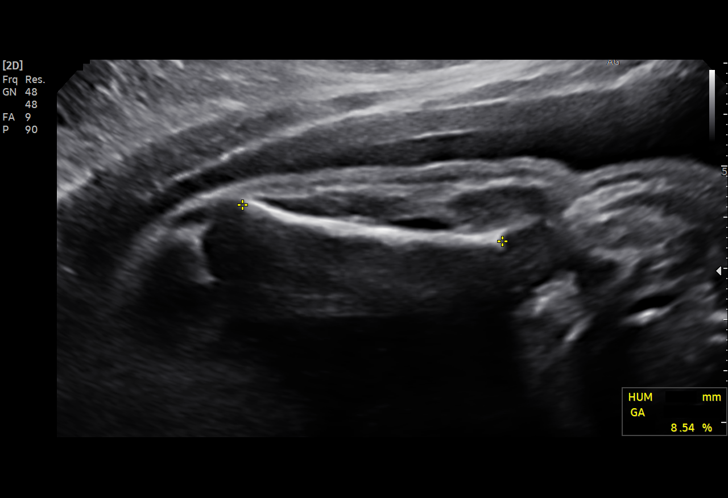
[im 8/24]
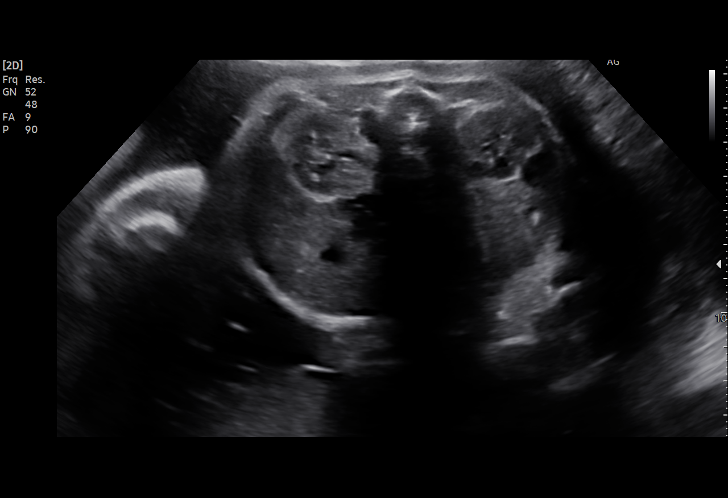
[im 9/24]
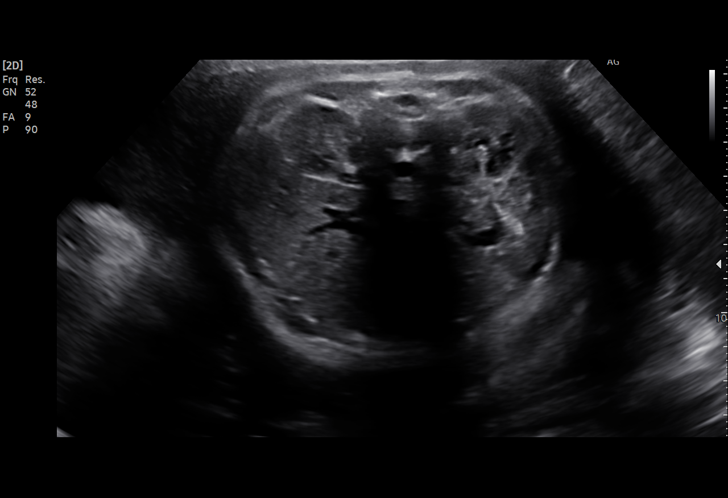
[im 11/24]
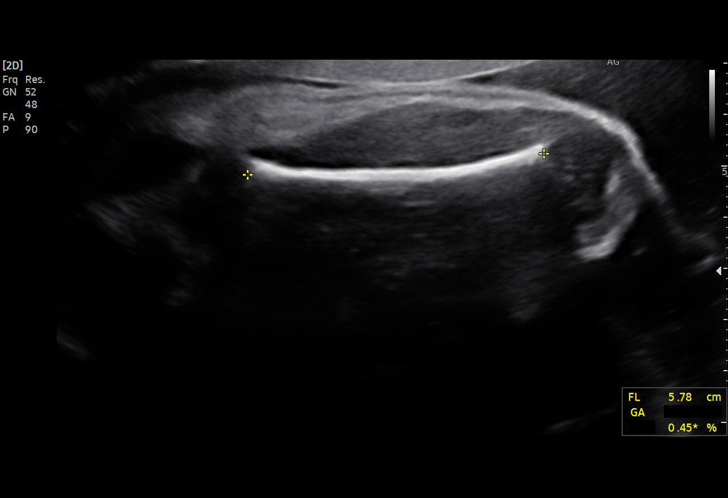
[im 13/24]
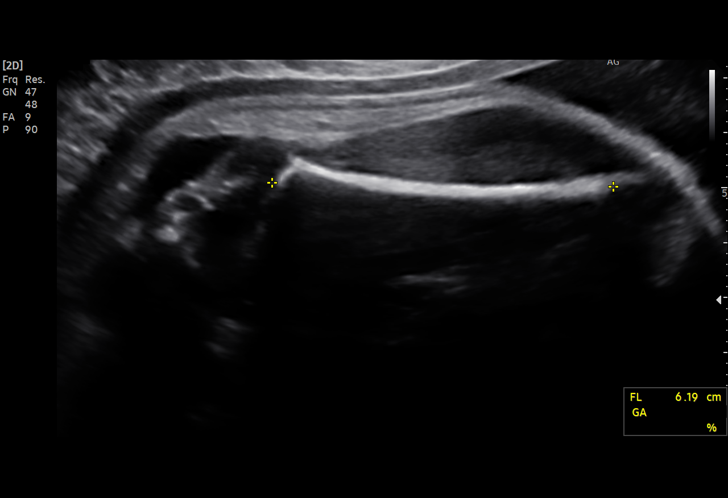
[im 14/24]
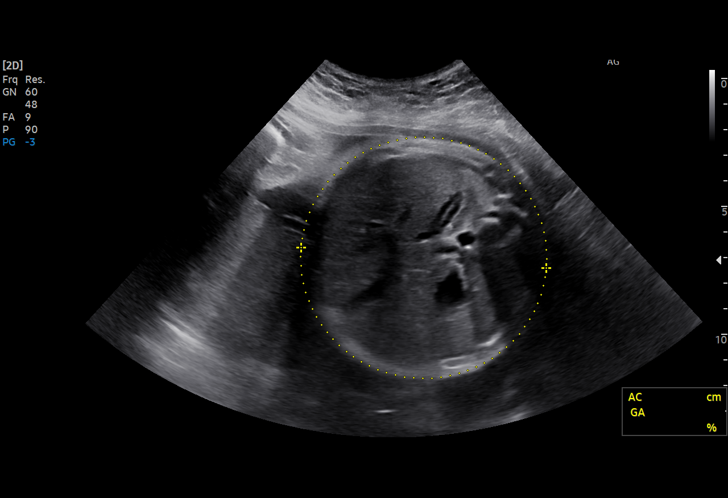
[im 16/24]
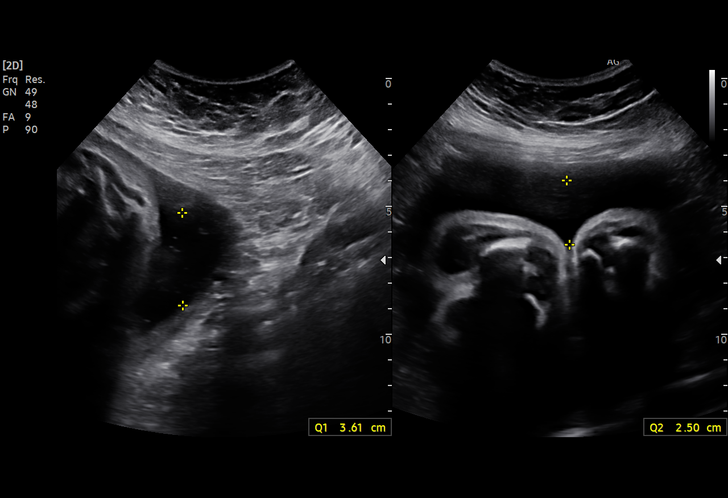
[im 17/24]
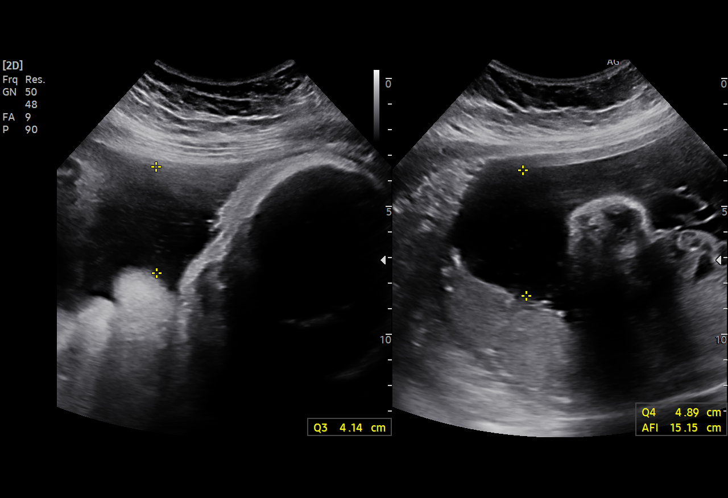
[im 19/24]
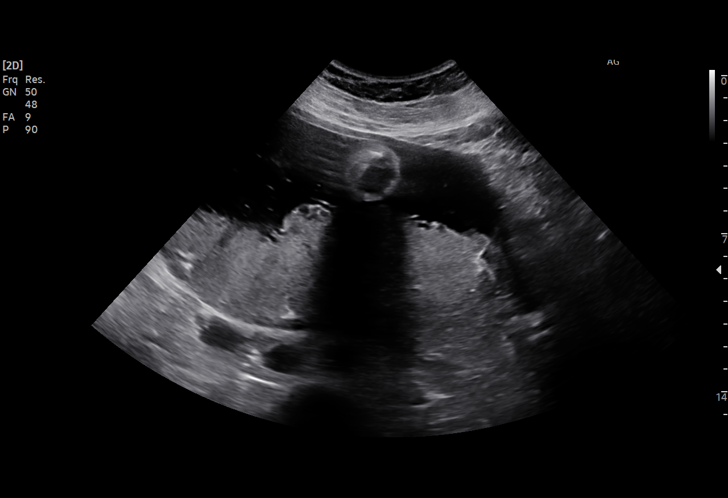
[im 21/24]
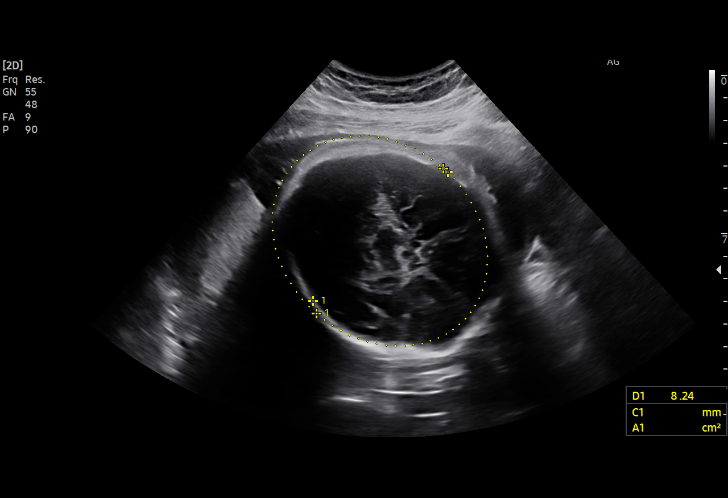
[im 22/24]
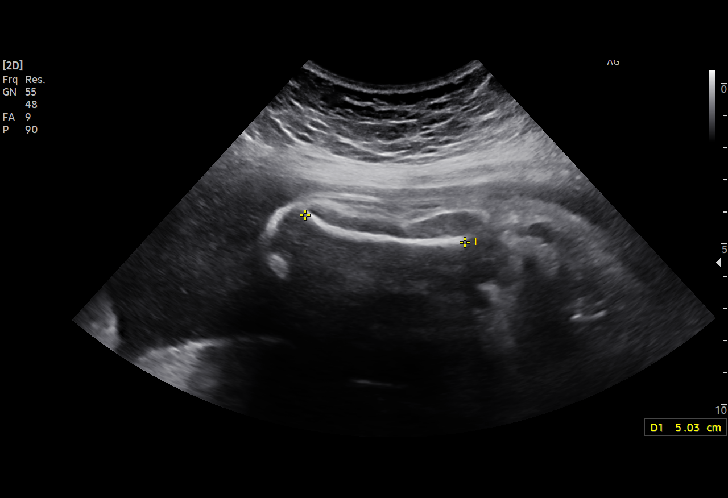
[im 24/24]
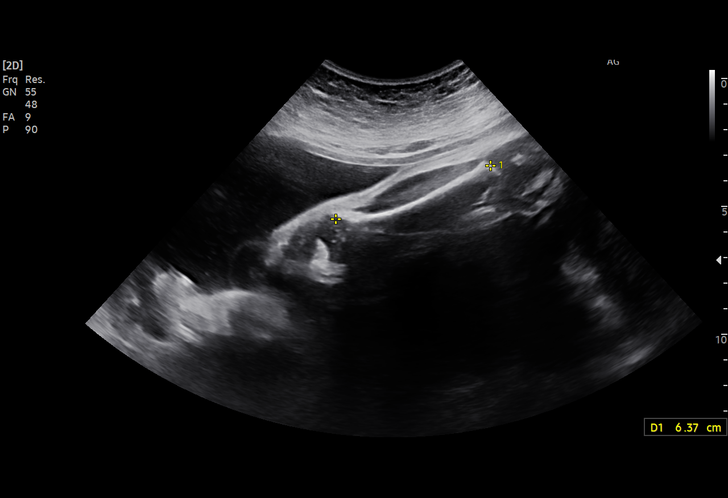

[15 of 24 positions shown; findings below may reference images not displayed]

FINDINGS: Number of Fetuses: 1

Heart Rate:  147 bpm

Movement: Yes

Presentation: Cephalic

Previa: No

Placental Location: Posterior

Amniotic Fluid (Subjective): Normal

Amniotic Fluid (Objective):

AFI 15.2 cm (5%ile= 8.3 cm, 95%= 24.5 cm for 33 wks)

FETAL BIOMETRY

BPD:  8.9cm 35w 5d

HC:    32.3cm 36w 4d

AC:    29.9cm 33w 6d

FL:    6.1cm 31w 4d

Current Mean GA: 33w 4d US EDC: 12/20/2020

Assigned GA: 33w 3d Assigned EDC: 12/21/2020

Estimated Fetal Weight:  2,245g 61%ile

FETAL ANATOMY

Lateral Ventricles: Previously seen

Thalami/CSP: Appears normal

Posterior Fossa: Previously seen

Nuchal Region: Previously seen

Upper Lip: Previously seen

Spine: Previously seen

4 Chamber Heart on Left: Previously seen

LVOT: Previously seen

RVOT: Previously seen

Stomach on Left: Appears normal

3 Vessel Cord: Previously seen

Cord Insertion site: Previously seen

Kidneys: Appears normal

Bladder: Previously seen

Extremities: Previously seen

Sex: Previously Seen

Technical Limitations: Advanced gestational age

Maternal Findings:

Cervix: Cervix length approximately 3.1 cm on limited transabdominal
views with no evidence of internal cervical funneling.
IMPRESSION: 1. Single living intrauterine gestation in cephalic lie at 33 weeks
4 days by average ultrasound age, with appropriate interval fetal
growth.
2. Normal amniotic fluid volume.  AFI 15.2.
3. No fetal or maternal abnormalities detected.

## 2023-02-04 ENCOUNTER — Ambulatory Visit: Payer: BC Managed Care – PPO | Admitting: Physical Therapy

## 2023-02-18 NOTE — L&D Delivery Note (Signed)
 Delivery Note  SVD viable female Apgars 9,9 over 2nd degree ML lac.  Placenta delivered spontaneously intact with 3VC. Repair with 2-0 with good support and hemostasis noted.  R/V exam confirms. .   Mother and baby to couplet care and are doing well.  EBL 132 cc  Belle Box, MD

## 2023-04-14 IMAGING — US US ABDOMEN LIMITED
1 series · 14 of 25 positions shown · non-contrast
Comparison: None.

CLINICAL DATA: Upper abdominal pain for 3 days.

EXAM:
ULTRASOUND ABDOMEN LIMITED RIGHT UPPER QUADRANT

[Series 1: us abdomen limited · 0.20mm/px · 14 of 49 slices shown]
[im 1/49]
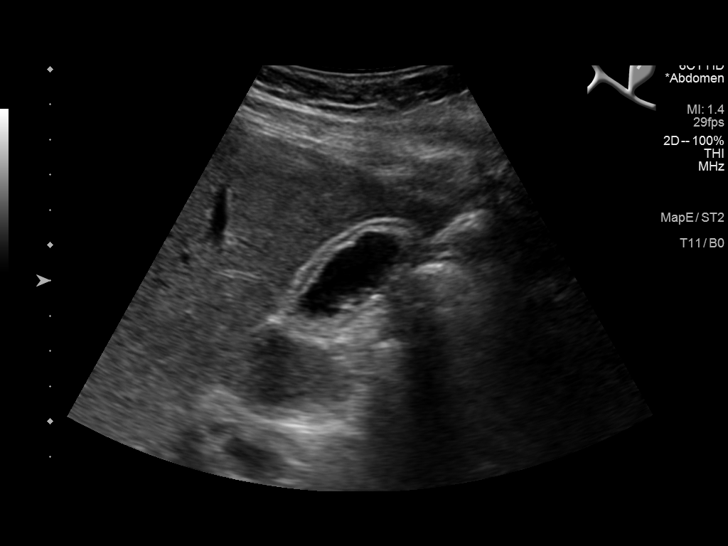
[im 5/49]
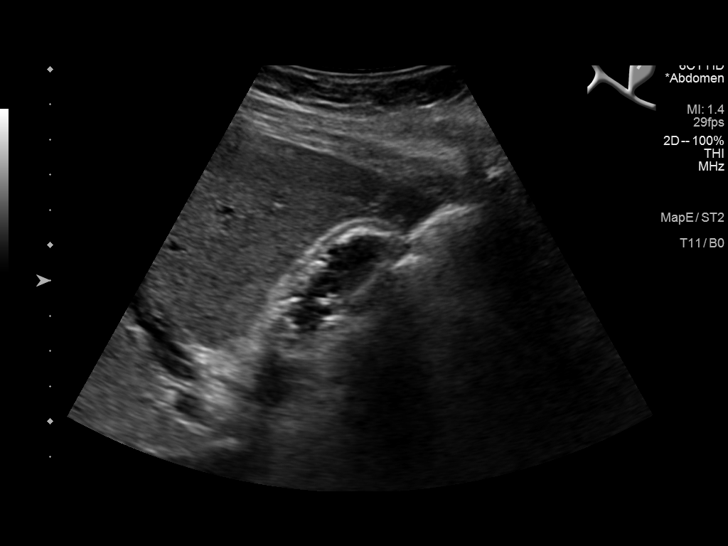
[im 9/49]
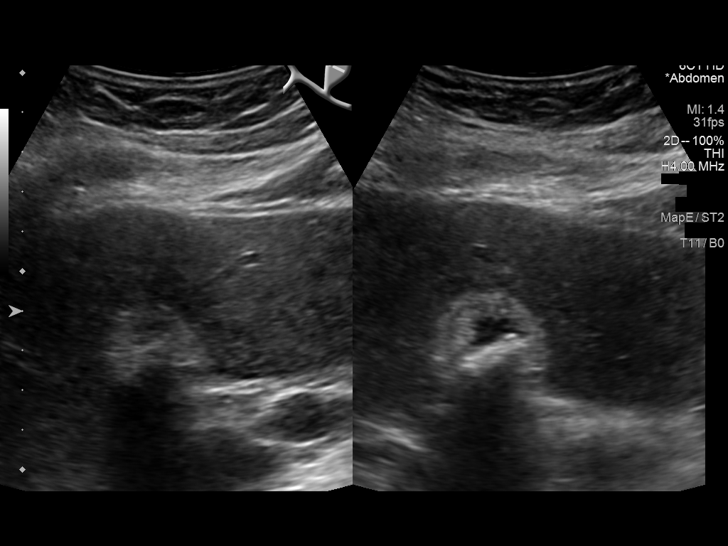
[im 13/49]
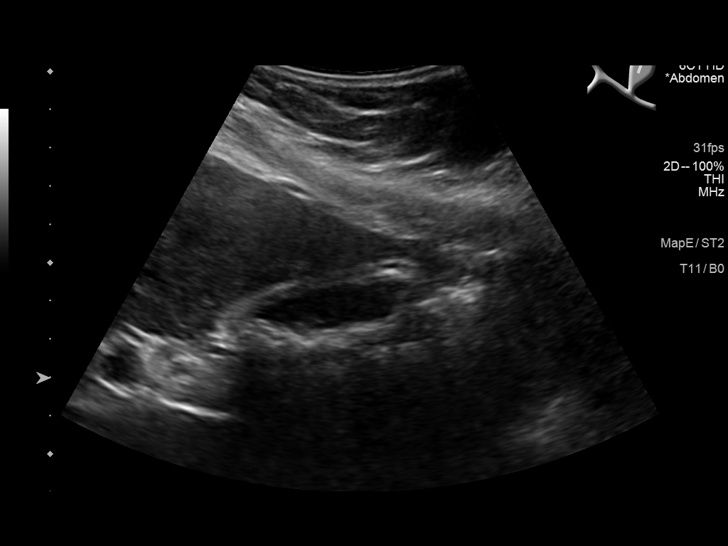
[im 17/49]
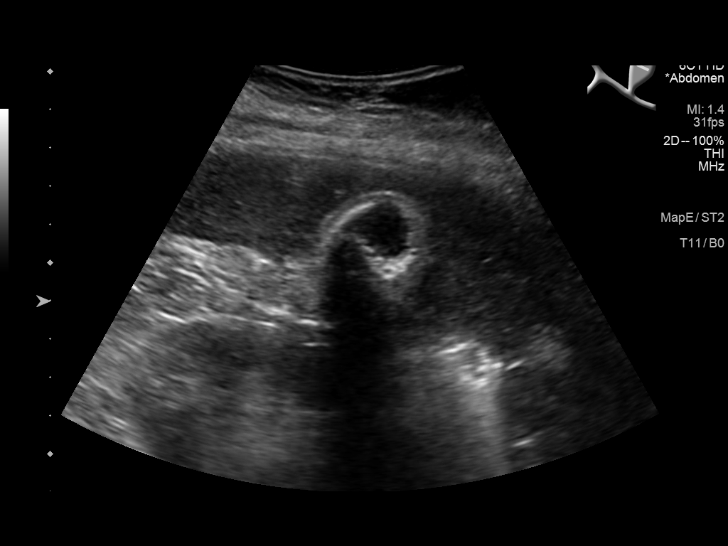
[im 19/49]
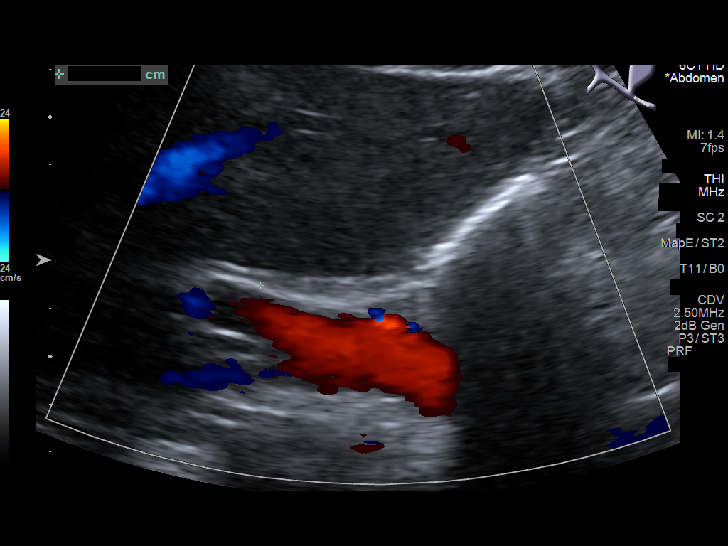
[im 23/49]
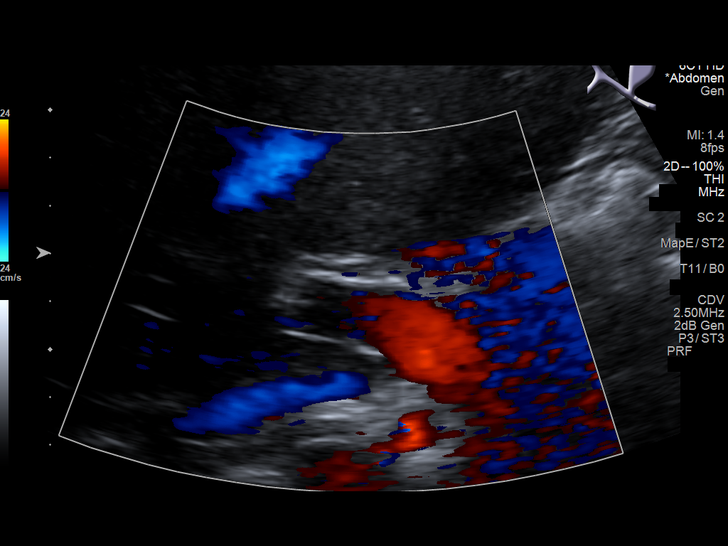
[im 27/49]
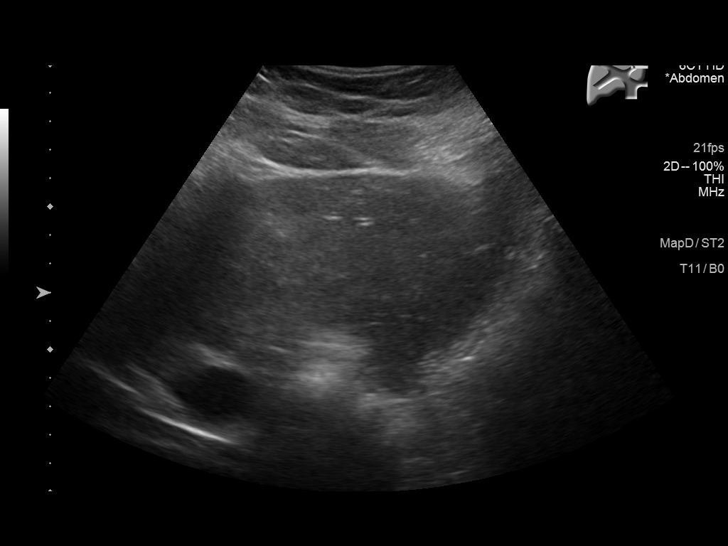
[im 31/49]
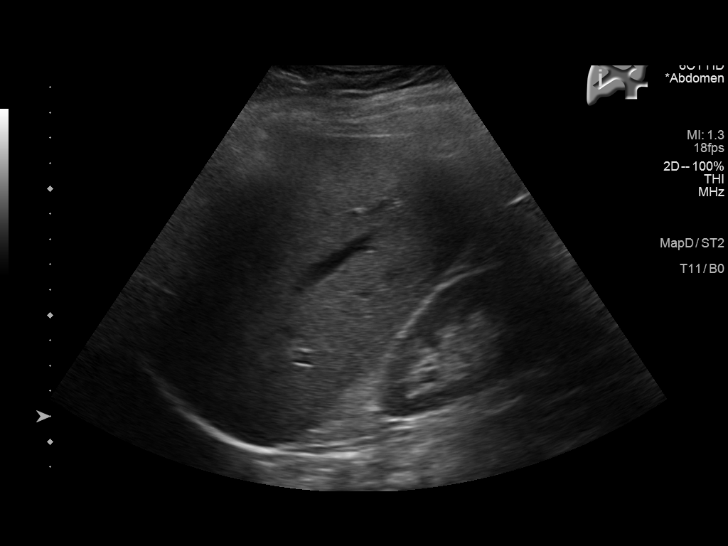
[im 33/49]
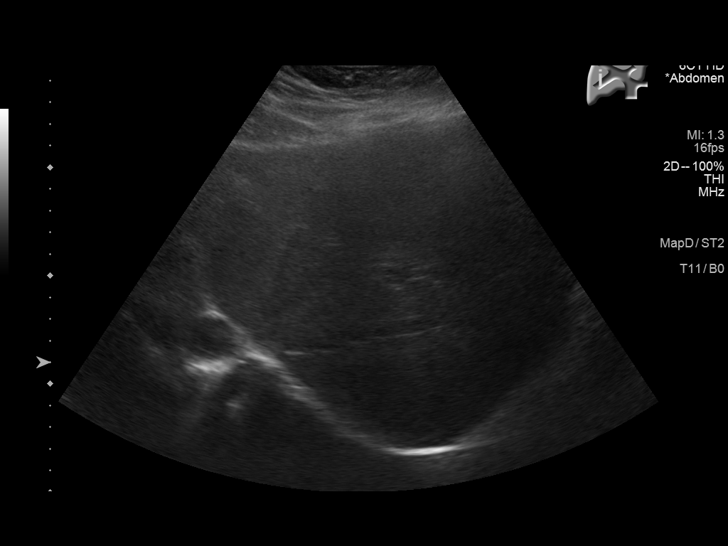
[im 37/49]
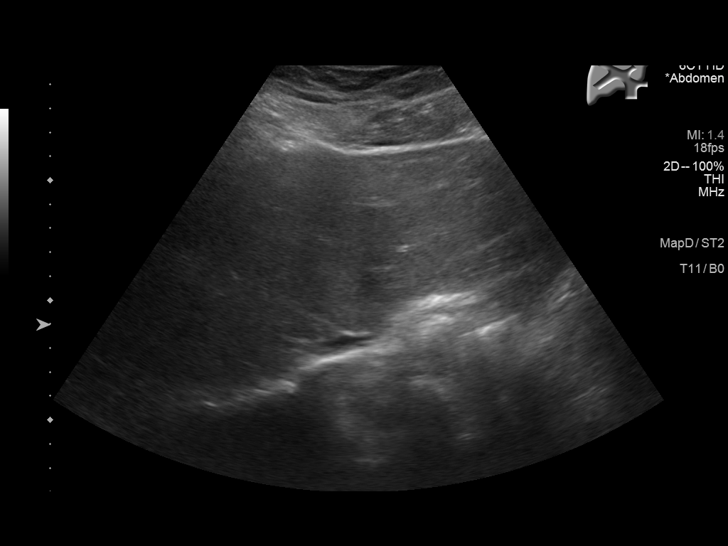
[im 41/49]
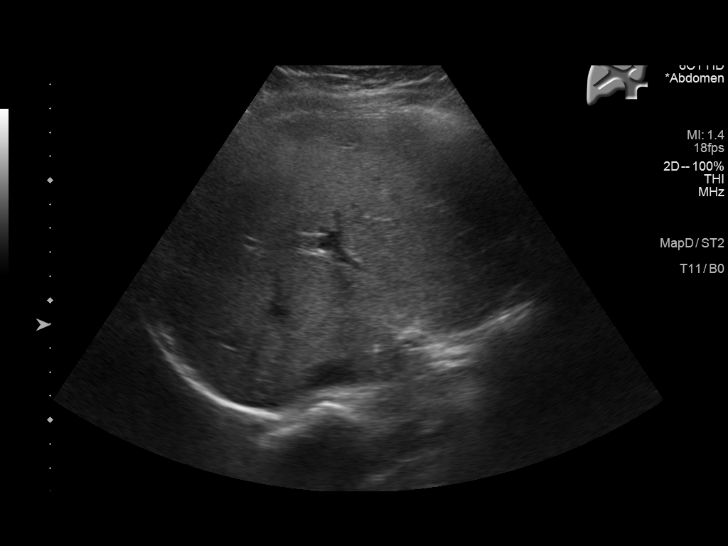
[im 45/49]
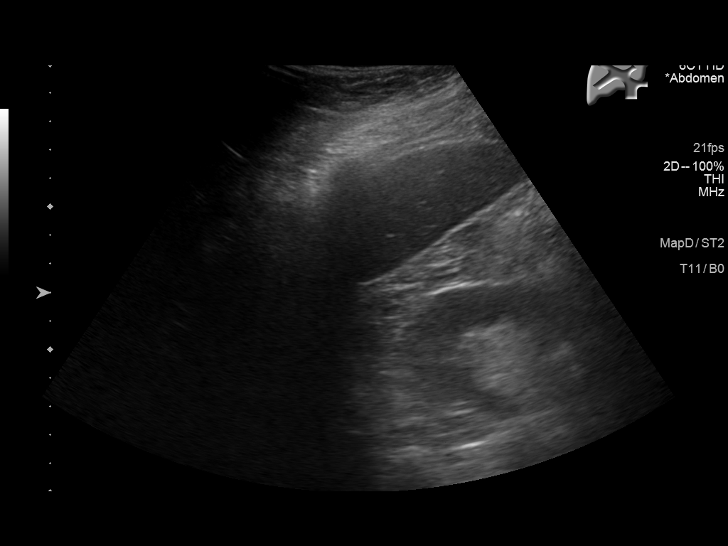
[im 49/49]
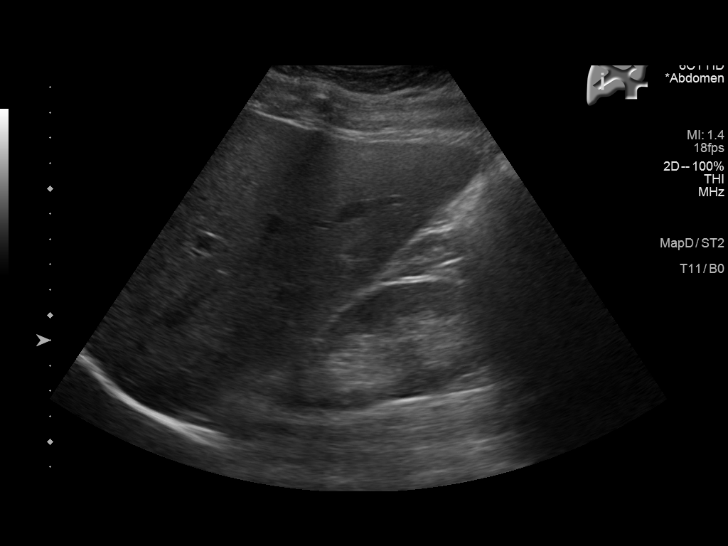

[14 of 25 positions shown; findings below may reference images not displayed]

FINDINGS: Gallbladder:

Gallstones measuring up to 8 mm. Edematous gallbladder wall
measuring up to 5 mm in thickness. No sonographic Murphy sign noted
by sonographer.

Common bile duct:

Diameter: 4 mm

Liver:

No focal lesion identified. Within normal limits in parenchymal
echogenicity. Portal vein is patent on color Doppler imaging with
normal direction of blood flow towards the liver.

Other: None.
IMPRESSION: Cholelithiasis and gallbladder wall thickening, suspicious for acute
cholecystitis in the appropriate clinical setting.

These results will be called to the ordering clinician or
representative by the Radiologist Assistant, and communication
documented in the PACS or [REDACTED].

## 2023-04-16 IMAGING — RF DG CHOLANGIOGRAM OPERATIVE
1 series · 1 of 1 positions shown · non-contrast
Comparison: Right upper quadrant ultrasound on 01/16/2021

CLINICAL DATA: Cholecystectomy for symptomatic cholelithiasis.

EXAM:
INTRAOPERATIVE CHOLANGIOGRAM
TECHNIQUE: Cholangiographic images from the C-arm fluoroscopic device were
submitted for interpretation post-operatively. Please see the
procedural report for the amount of contrast and the fluoroscopy
time utilized.

[Series 1: run · 1 of 1 slices shown]
[im 1/1]
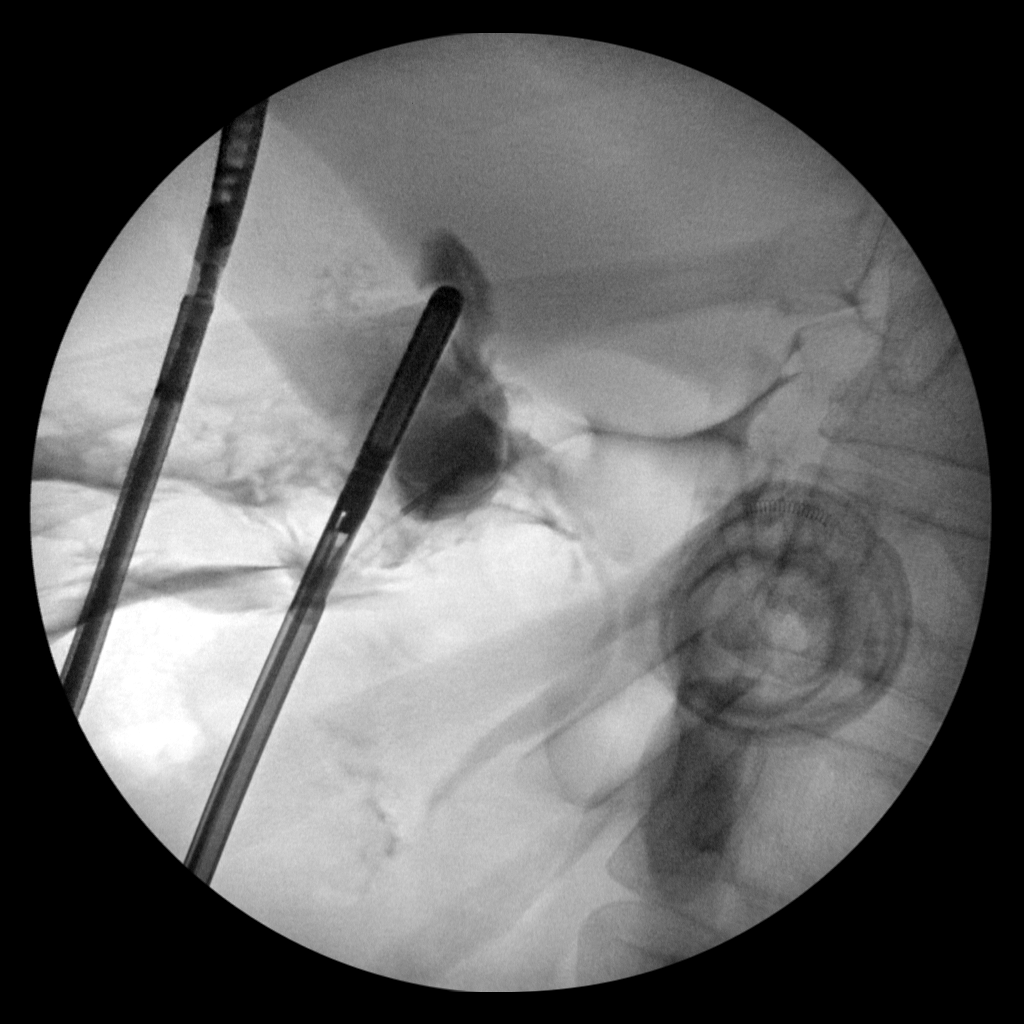

[1 of 1 positions shown; findings below may reference images not displayed]

FINDINGS: Submitted intraoperative imaging demonstrates contrast injection
with faint opacification the common bile duct which appears to be of
normal caliber without filling defect. A small amount of contrast is
seen in the duodenum. There is some extravasation of contrast at the
injection site.
IMPRESSION: Unremarkable intraoperative cholangiogram with faint opacification
of the common bile duct which appears unremarkable.

## 2023-05-01 ENCOUNTER — Inpatient Hospital Stay (HOSPITAL_COMMUNITY)
Admission: AD | Admit: 2023-05-01 | Discharge: 2023-05-01 | Disposition: A | Discharge status: Home Health | Attending: Obstetrics & Gynecology | Admitting: Obstetrics & Gynecology

## 2023-05-01 ENCOUNTER — Encounter (HOSPITAL_COMMUNITY): Payer: Self-pay | Admitting: *Deleted

## 2023-05-01 ENCOUNTER — Other Ambulatory Visit: Payer: Self-pay

## 2023-05-01 DIAGNOSIS — O26893 Other specified pregnancy related conditions, third trimester: Secondary | ICD-10-CM

## 2023-05-01 DIAGNOSIS — Z3A33 33 weeks gestation of pregnancy: Secondary | ICD-10-CM | POA: Diagnosis not present

## 2023-05-01 DIAGNOSIS — O9A213 Injury, poisoning and certain other consequences of external causes complicating pregnancy, third trimester: Secondary | ICD-10-CM | POA: Insufficient documentation

## 2023-05-01 MED ORDER — CYCLOBENZAPRINE HCL 5 MG PO TABS: 5.0000 mg | ORAL_TABLET | Freq: Three times a day (TID) | ORAL | 0 refills | Status: AC | PRN
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
## 2023-05-20 LAB — OB RESULTS CONSOLE GBS: GBS: NEGATIVE

## 2023-06-10 ENCOUNTER — Encounter (HOSPITAL_COMMUNITY): Payer: Self-pay | Admitting: *Deleted

## 2023-06-10 ENCOUNTER — Telehealth (HOSPITAL_COMMUNITY): Payer: Self-pay | Admitting: *Deleted

## 2023-06-10 ENCOUNTER — Encounter (HOSPITAL_COMMUNITY): Payer: Self-pay

## 2023-06-10 NOTE — Telephone Encounter (Signed)
 Preadmission screen

## 2023-06-18 ENCOUNTER — Inpatient Hospital Stay (HOSPITAL_COMMUNITY)
Admission: AD | Admit: 2023-06-18 | Discharge: 2023-06-20 | DRG: 807 | Disposition: A | Discharge status: Home / Self Care | Attending: Obstetrics and Gynecology | Admitting: Obstetrics and Gynecology

## 2023-06-18 ENCOUNTER — Inpatient Hospital Stay (HOSPITAL_COMMUNITY): Admitting: Anesthesiology

## 2023-06-18 ENCOUNTER — Other Ambulatory Visit: Payer: Self-pay

## 2023-06-18 ENCOUNTER — Encounter (HOSPITAL_COMMUNITY): Payer: Self-pay | Admitting: Obstetrics and Gynecology

## 2023-06-18 DIAGNOSIS — Z3A40 40 weeks gestation of pregnancy: Secondary | ICD-10-CM | POA: Diagnosis not present

## 2023-06-18 DIAGNOSIS — O9902 Anemia complicating childbirth: Principal | ICD-10-CM | POA: Diagnosis present

## 2023-06-18 DIAGNOSIS — O99214 Obesity complicating childbirth: Secondary | ICD-10-CM | POA: Diagnosis present

## 2023-06-18 DIAGNOSIS — O26893 Other specified pregnancy related conditions, third trimester: Secondary | ICD-10-CM | POA: Diagnosis present

## 2023-06-18 LAB — CBC
HCT: 33.8 % — ABNORMAL LOW (ref 36.0–46.0)
Hemoglobin: 11 g/dL — ABNORMAL LOW (ref 12.0–15.0)
MCH: 26.4 pg (ref 26.0–34.0)
MCHC: 32.5 g/dL (ref 30.0–36.0)
MCV: 81.1 fL (ref 80.0–100.0)
Platelets: 377 10*3/uL (ref 150–400)
RBC: 4.17 MIL/uL (ref 3.87–5.11)
RDW: 12.2 % (ref 11.5–15.5)
WBC: 11.4 10*3/uL — ABNORMAL HIGH (ref 4.0–10.5)
nRBC: 0 % (ref 0.0–0.2)

## 2023-06-18 LAB — COMPREHENSIVE METABOLIC PANEL WITH GFR
ALT: 15 U/L (ref 0–44)
AST: 17 U/L (ref 15–41)
Albumin: 2.9 g/dL — ABNORMAL LOW (ref 3.5–5.0)
Alkaline Phosphatase: 170 U/L — ABNORMAL HIGH (ref 38–126)
Anion gap: 8 (ref 5–15)
BUN: 5 mg/dL — ABNORMAL LOW (ref 6–20)
CO2: 22 mmol/L (ref 22–32)
Calcium: 9.1 mg/dL (ref 8.9–10.3)
Chloride: 107 mmol/L (ref 98–111)
Creatinine, Ser: 0.58 mg/dL (ref 0.44–1.00)
GFR, Estimated: >60 mL/min (ref >60)
Glucose, Bld: 77 mg/dL (ref 70–99)
Potassium: 3.6 mmol/L (ref 3.5–5.1)
Sodium: 137 mmol/L (ref 135–145)
Total Bilirubin: 0.5 mg/dL (ref 0.0–1.2)
Total Protein: 6.4 g/dL — ABNORMAL LOW (ref 6.5–8.1)

## 2023-06-18 LAB — TYPE AND SCREEN
ABO/RH(D): A POS
Antibody Screen: NEGATIVE

## 2023-06-18 LAB — PROTEIN / CREATININE RATIO, URINE
Creatinine, Urine: 91 mg/dL
Protein Creatinine Ratio: 0.13 mg/mg{creat} (ref 0.00–0.15)
Total Protein, Urine: 12 mg/dL

## 2023-06-18 MED ORDER — LACTATED RINGERS IV SOLN: 500.0000 mL | Freq: Once | INTRAVENOUS | Status: DC

## 2023-06-18 MED ORDER — DIPHENHYDRAMINE HCL 25 MG PO CAPS: 25.0000 mg | ORAL_CAPSULE | Freq: Four times a day (QID) | ORAL | Status: DC | PRN

## 2023-06-18 MED ORDER — ONDANSETRON HCL 4 MG PO TABS: 4.0000 mg | ORAL_TABLET | ORAL | Status: DC | PRN

## 2023-06-18 MED ORDER — LACTATED RINGERS IV SOLN: 500.0000 mL | INTRAVENOUS | Status: DC | PRN

## 2023-06-18 MED ORDER — SENNOSIDES-DOCUSATE SODIUM 8.6-50 MG PO TABS
2.0000 | ORAL_TABLET | Freq: Every day | ORAL | Status: DC
  Administered 2023-06-19 – 2023-06-20 (×2): 2 via ORAL
  Filled 2023-06-18 (×2): qty 2

## 2023-06-18 MED ORDER — ACETAMINOPHEN 325 MG PO TABS: 650.0000 mg | ORAL_TABLET | ORAL | Status: DC | PRN

## 2023-06-18 MED ORDER — SOD CITRATE-CITRIC ACID 500-334 MG/5ML PO SOLN: 30.0000 mL | ORAL | Status: DC | PRN

## 2023-06-18 MED ORDER — PRENATAL MULTIVITAMIN CH
1.0000 | ORAL_TABLET | Freq: Every day | ORAL | Status: DC
  Administered 2023-06-19 – 2023-06-20 (×2): 1 via ORAL
  Filled 2023-06-18 (×2): qty 1

## 2023-06-18 MED ORDER — EPHEDRINE 5 MG/ML INJ: 10.0000 mg | INTRAVENOUS | Status: DC | PRN

## 2023-06-18 MED ORDER — IBUPROFEN 600 MG PO TABS
600.0000 mg | ORAL_TABLET | Freq: Four times a day (QID) | ORAL | Status: DC
  Administered 2023-06-19 – 2023-06-20 (×7): 600 mg via ORAL
  Filled 2023-06-18 (×7): qty 1

## 2023-06-18 MED ORDER — OXYCODONE-ACETAMINOPHEN 5-325 MG PO TABS: 2.0000 | ORAL_TABLET | ORAL | Status: DC | PRN

## 2023-06-18 MED ORDER — ONDANSETRON HCL 4 MG/2ML IJ SOLN: 4.0000 mg | INTRAMUSCULAR | Status: DC | PRN

## 2023-06-18 MED ORDER — OXYTOCIN-SODIUM CHLORIDE 30-0.9 UT/500ML-% IV SOLN
1.0000 m[IU]/min | INTRAVENOUS | Status: DC
  Administered 2023-06-18: 2 m[IU]/min via INTRAVENOUS

## 2023-06-18 MED ORDER — FENTANYL-BUPIVACAINE-NACL 0.5-0.125-0.9 MG/250ML-% EP SOLN
12.0000 mL/h | EPIDURAL | Status: DC | PRN
  Administered 2023-06-18: 12 mL/h via EPIDURAL
  Filled 2023-06-18: qty 250

## 2023-06-18 MED ORDER — PHENYLEPHRINE 80 MCG/ML (10ML) SYRINGE FOR IV PUSH (FOR BLOOD PRESSURE SUPPORT): 80.0000 ug | PREFILLED_SYRINGE | INTRAVENOUS | Status: DC | PRN

## 2023-06-18 MED ORDER — LIDOCAINE HCL (PF) 1 % IJ SOLN
INTRAMUSCULAR | Status: DC | PRN
  Administered 2023-06-18: 5 mL via EPIDURAL
  Administered 2023-06-18: 3 mL via EPIDURAL
  Administered 2023-06-18: 2 mL via EPIDURAL

## 2023-06-18 MED ORDER — LIDOCAINE HCL (PF) 1 % IJ SOLN: 30.0000 mL | INTRAMUSCULAR | Status: DC | PRN

## 2023-06-18 MED ORDER — OXYTOCIN BOLUS FROM INFUSION: 333.0000 mL | Freq: Once | INTRAVENOUS | Status: DC

## 2023-06-18 MED ORDER — MEASLES, MUMPS & RUBELLA VAC IJ SOLR: 0.5000 mL | Freq: Once | INTRAMUSCULAR | Status: DC

## 2023-06-18 MED ORDER — ZOLPIDEM TARTRATE 5 MG PO TABS: 5.0000 mg | ORAL_TABLET | Freq: Every evening | ORAL | Status: DC | PRN

## 2023-06-18 MED ORDER — TETANUS-DIPHTH-ACELL PERTUSSIS 5-2.5-18.5 LF-MCG/0.5 IM SUSY: 0.5000 mL | PREFILLED_SYRINGE | Freq: Once | INTRAMUSCULAR | Status: DC

## 2023-06-18 MED ORDER — OXYCODONE-ACETAMINOPHEN 5-325 MG PO TABS: 1.0000 | ORAL_TABLET | ORAL | Status: DC | PRN

## 2023-06-18 MED ORDER — WITCH HAZEL-GLYCERIN EX PADS: 1.0000 | MEDICATED_PAD | CUTANEOUS | Status: DC | PRN

## 2023-06-18 MED ORDER — MEDROXYPROGESTERONE ACETATE 150 MG/ML IM SUSP: 150.0000 mg | INTRAMUSCULAR | Status: DC | PRN

## 2023-06-18 MED ORDER — ONDANSETRON HCL 4 MG/2ML IJ SOLN: 4.0000 mg | Freq: Four times a day (QID) | INTRAMUSCULAR | Status: DC | PRN

## 2023-06-18 MED ORDER — TERBUTALINE SULFATE 1 MG/ML IJ SOLN: 0.2500 mg | Freq: Once | INTRAMUSCULAR | Status: DC | PRN

## 2023-06-18 MED ORDER — OXYTOCIN-SODIUM CHLORIDE 30-0.9 UT/500ML-% IV SOLN
2.5000 [IU]/h | INTRAVENOUS | Status: DC
  Filled 2023-06-18: qty 500

## 2023-06-18 MED ORDER — LACTATED RINGERS IV SOLN: INTRAVENOUS | Status: DC

## 2023-06-18 MED ORDER — SIMETHICONE 80 MG PO CHEW: 80.0000 mg | CHEWABLE_TABLET | ORAL | Status: DC | PRN

## 2023-06-18 MED ORDER — COCONUT OIL OIL: 1.0000 | TOPICAL_OIL | Status: DC | PRN

## 2023-06-18 MED ORDER — FENTANYL CITRATE (PF) 100 MCG/2ML IJ SOLN: 50.0000 ug | INTRAMUSCULAR | Status: DC | PRN

## 2023-06-18 MED ORDER — DULOXETINE HCL 30 MG PO CPEP
30.0000 mg | ORAL_CAPSULE | Freq: Every day | ORAL | Status: DC
  Administered 2023-06-19 – 2023-06-20 (×2): 30 mg via ORAL
  Filled 2023-06-18 (×2): qty 1

## 2023-06-18 MED ORDER — DIPHENHYDRAMINE HCL 50 MG/ML IJ SOLN: 12.5000 mg | INTRAMUSCULAR | Status: DC | PRN

## 2023-06-18 MED ORDER — DIBUCAINE (PERIANAL) 1 % EX OINT: 1.0000 | TOPICAL_OINTMENT | CUTANEOUS | Status: DC | PRN

## 2023-06-18 MED ORDER — BENZOCAINE-MENTHOL 20-0.5 % EX AERO: 1.0000 | INHALATION_SPRAY | CUTANEOUS | Status: DC | PRN

## 2023-06-18 NOTE — Lactation Note (Signed)
 This note was copied from a baby's chart. Lactation Consultation Note  Patient Name: Ariel Thompson [id number] Date: 06/18/2023 Age:32 hours  MOB does not want to be seen tonight by [facility] services but would like to be seen in the morning.   Maternal Data    Feeding    LATCH Score Latch: Grasps breast easily, tongue down, lips flanged, rhythmical sucking.  Audible Swallowing: Spontaneous and intermittent  Type of Nipple: Everted at rest and after stimulation  Comfort (Breast/Nipple): Soft / non-tender  Hold (Positioning): No assistance needed to correctly position infant at breast.  LATCH Score: 10   Lactation Tools Discussed/Used    Interventions    Discharge    Consult Status      [name] 06/18/2023, 11:30 PM

## 2023-06-18 NOTE — Anesthesia Procedure Notes (Signed)
 Epidural Patient location during procedure: OB Start time: 06/18/2023 3:40 PM End time: 06/18/2023 3:47 PM  Staffing Anesthesiologist: Erin Havers, MD Performed: anesthesiologist   Preanesthetic Checklist Completed: patient identified, IV checked, risks and benefits discussed, monitors and equipment checked, pre-op evaluation and timeout performed  Epidural Patient position: sitting Prep: DuraPrep Patient monitoring: blood pressure and continuous pulse ox Approach: midline Location: L3-L4 Injection technique: LOR air  Needle:  Needle type: Tuohy  Needle gauge: 17 G Needle length: 9 cm Needle insertion depth: 6 cm Catheter size: 19 Gauge Catheter at skin depth: 11 cm Test dose: negative and Other (1% Lidocaine )  Additional Notes Patient identified.  Risk benefits discussed including failed block, incomplete pain control, headache, nerve damage, paralysis, blood pressure changes, nausea, vomiting, reactions to medication both toxic or allergic, and postpartum back pain.  Patient expressed understanding and wished to proceed.  All questions were answered.  Sterile technique used throughout procedure and epidural site dressed with sterile barrier dressing. No paresthesia or other complications noted. The patient did not experience any signs of intravascular injection such as tinnitus or metallic taste in mouth nor signs of intrathecal spread such as rapid motor block. Please see nursing notes for vital signs. Reason for block:procedure for pain

## 2023-06-18 NOTE — Progress Notes (Signed)
 Patient evaluated by Monta Anton RN for labor check  SVE: 3.5/50-3  FHR Cat 1, reactive (130 baseline, mod variability, accels, no decels) Toco : Ctx q3-4 Initial BP elevated 140/94 DW DR Asencion Blacksmith ( Ob Attending)  Admit to LD Routine LD orders placed per DR Asencion Blacksmith

## 2023-06-18 NOTE — MAU Note (Signed)
.  Ariel Thompson is a 33 y.o. at [redacted]w[redacted]d here in MAU reporting: Contractions this morning at 0745 progressively increased in frequency, duration and have become more painful. Denies leaking fluid, vag bleeding.  Baby moving like normal.  Pain score: 5 Vitals:   06/18/23 1236  BP: (!) 140/94  Pulse: (!) 103  Resp: 18  Temp: 98.1 F (36.7 C)  SpO2: 98%

## 2023-06-18 NOTE — Anesthesia Preprocedure Evaluation (Signed)
 Anesthesia Evaluation  Patient identified by MRN, date of birth, ID band Patient awake    Reviewed: Allergy & Precautions, NPO status , Patient's Chart, lab work & pertinent test results  Airway Mallampati: III  TM Distance: >3 FB Neck ROM: Full    Dental  (+) Teeth Intact, Dental Advisory Given   Pulmonary neg pulmonary ROS   Pulmonary exam normal breath sounds clear to auscultation       Cardiovascular negative cardio ROS Normal cardiovascular exam Rhythm:Regular Rate:Normal     Neuro/Psych  Headaches PSYCHIATRIC DISORDERS Anxiety Depression       GI/Hepatic negative GI ROS, Neg liver ROS,,,  Endo/Other  Obesity   Renal/GU negative Renal ROS     Musculoskeletal negative musculoskeletal ROS (+)    Abdominal   Peds  Hematology  (+) Blood dyscrasia, anemia Plt 377k    Anesthesia Other Findings Day of surgery medications reviewed with the patient.  Reproductive/Obstetrics (+) Pregnancy                             Anesthesia Physical Anesthesia Plan  ASA: 2  Anesthesia Plan: Epidural   Post-op Pain Management:    Induction:   PONV Risk Score and Plan: 2 and Treatment may vary due to age or medical condition  Airway Management Planned: Natural Airway  Additional Equipment:   Intra-op Plan:   Post-operative Plan:   Informed Consent: I have reviewed the patients History and Physical, chart, labs and discussed the procedure including the risks, benefits and alternatives for the proposed anesthesia with the patient or authorized representative who has indicated his/her understanding and acceptance.     Dental advisory given  Plan Discussed with:   Anesthesia Plan Comments: (Patient identified. Risks/Benefits/Options discussed with patient including but not limited to bleeding, infection, nerve damage, paralysis, failed block, incomplete pain control, headache, blood pressure  changes, nausea, vomiting, reactions to medication both or allergic, itching and postpartum back pain. Confirmed with bedside nurse the patient's most recent platelet count. Confirmed with patient that they are not currently taking any anticoagulation, have any bleeding history or any family history of bleeding disorders. Patient expressed understanding and wished to proceed. All questions were answered. )       Anesthesia Quick Evaluation

## 2023-06-18 NOTE — H&P (Signed)
 Ariel Thompson is a 33 y.o. female presenting for labor sxs.  Was on the schedule for eIOL and now with cx change and strong ctxs.  Pregnancy uncomplicated. OB History     Gravida  2   Para  1   Term  1   Preterm  0   AB  0   Living  1      SAB  0   IAB  0   Ectopic  0   Multiple  0   Live Births  1          Past Medical History:  Diagnosis Date   Depression with anxiety    Dysplastic nevus 08/02/2009   L thigh - mild   Past Surgical History:  Procedure Laterality Date   CHOLECYSTECTOMY N/A 01/18/2021   Procedure: LAPAROSCOPIC CHOLECYSTECTOMY WITH INTRAOPERATIVE CHOLANGIOGRAM;  Surgeon: Marshall Skeeter, MD;  Location: [facility] ORS;  Service: General;  Laterality: N/A;   TONSILLECTOMY  2010   TYMPANOSTOMY TUBE PLACEMENT     Family History: family history includes Allergic rhinitis in her brother and sister; Asthma in her sister; Cancer in her maternal grandmother; Clotting disorder in her mother; Depression in her father; Heart Problems in her brother; Hypothyroidism in her mother; Migraines in her brother, mother, and sister; Mitral valve prolapse in her mother; Other in her mother. Social History:  reports that she has never smoked. She has never used smokeless tobacco. She reports that she does not currently use alcohol. She reports that she does not use drugs.     Maternal Diabetes: No Genetic Screening: Normal Maternal Ultrasounds/Referrals: Normal Fetal Ultrasounds or other Referrals:  None Maternal Substance Abuse:  No Significant Maternal Medications:  None Significant Maternal Lab Results:  Group B Strep negative Number of Prenatal Visits:greater than 3 verified prenatal visits Maternal Vaccinations:TDap Other Comments:  None  Review of Systems History Dilation: 3.5 Effacement (%): 50 Station: -3 Exam by:: Ariel Singleton RN Blood pressure (!) 147/85, pulse 90, temperature 98.1 F (36.7 C), temperature source Oral, resp. rate 18, height 5\' 5"   (1.651 m), weight 100.2 kg, SpO2 98%, currently breastfeeding. Exam Physical Exam  Vitals and nursing note reviewed. Exam conducted with a chaperone present.  Constitutional:      Appearance: Normal appearance.  HENT:     Head: Normocephalic.  Eyes:     Pupils: Pupils are equal, round, and reactive to light.  Cardiovascular:     Rate and Rhythm: Normal rate and regular rhythm.     Pulses: Normal pulses.  Abdominal:     General: Abdomen is Gravid, nontender Neurological:     Mental Status: She is alert. Pt wishes Full resuscitation in the event of a code. Prenatal labs: ABO, Rh: --/--/PENDING (05/01 1423) Antibody: PENDING (05/01 1423) Rubella: Immune (10/02 0000) RPR: Nonreactive (10/02 0000)  HBsAg: Negative (10/02 0000)  HIV: Non-reactive (10/02 0000)  GBS: Negative/-- (04/02 0000)   Assessment/Plan: IUP at term Early labor entering active labor Wants CLEA then will AROM Anticipate SVD   Ariel Thompson 06/18/2023, 3:12 PM

## 2023-06-19 ENCOUNTER — Encounter (HOSPITAL_COMMUNITY): Payer: Self-pay | Admitting: Obstetrics and Gynecology

## 2023-06-19 LAB — CBC
HCT: 28.1 % — ABNORMAL LOW (ref 36.0–46.0)
Hemoglobin: 9.2 g/dL — ABNORMAL LOW (ref 12.0–15.0)
MCH: 26.6 pg (ref 26.0–34.0)
MCHC: 32.7 g/dL (ref 30.0–36.0)
MCV: 81.2 fL (ref 80.0–100.0)
Platelets: 327 10*3/uL (ref 150–400)
RBC: 3.46 MIL/uL — ABNORMAL LOW (ref 3.87–5.11)
RDW: 12.5 % (ref 11.5–15.5)
WBC: 12.9 10*3/uL — ABNORMAL HIGH (ref 4.0–10.5)
nRBC: 0 % (ref 0.0–0.2)

## 2023-06-19 LAB — RPR: RPR Ser Ql: NONREACTIVE

## 2023-06-19 NOTE — Social Work (Signed)
 MOB was referred for history of depression/anxiety.  * Referral screened out by Clinical Social Worker because none of the following criteria appear to apply:  ~ History of anxiety/depression during this pregnancy, or of post-partum depression following prior delivery.  ~ Diagnosis of anxiety and/or depression within last 3 years OR * MOB's symptoms currently being treated with medication and/or therapy. Per chart review MOB is currently prescribed and taking Cymbalta 30mg .   Please contact the Clinical Social Worker if needs arise, by [facility] request, or if MOB scores greater than 9/yes to question 10 on Edinburgh Postpartum Depression Screen.  Ariel Thompson, LCSWA Clinical Social Worker [phone]

## 2023-06-19 NOTE — Progress Notes (Signed)
 PPD # 1  Doing well No concerns BP 135/85 (BP Location: Left Arm)   Pulse 81   Temp 97.7 F (36.5 C) (Oral)   Resp 18   Ht 5\' 5"  (1.651 m)   Wt 100.2 kg   SpO2 100%   Breastfeeding Unknown   BMI 36.78 kg/m  Results for orders placed or performed during the hospital encounter of 06/18/23 (from the past 24 hours)  CBC     Status: Abnormal   Collection Time: 06/18/23  2:23 PM  Result Value Ref Range   WBC 11.4 (H) 4.0 - 10.5 K/uL   RBC 4.17 3.87 - 5.11 MIL/uL   Hemoglobin 11.0 (L) 12.0 - 15.0 g/dL   HCT 30.8 (L) 65.7 - 84.6 %   MCV 81.1 80.0 - 100.0 fL   MCH 26.4 26.0 - 34.0 pg   MCHC 32.5 30.0 - 36.0 g/dL   RDW 96.2 95.2 - 84.1 %   Platelets 377 150 - 400 K/uL   nRBC 0.0 0.0 - 0.2 %  Type and screen [facility]     Status: None   Collection Time: 06/18/23  2:23 PM  Result Value Ref Range   ABO/RH(D) A POS    Antibody Screen NEG    Sample Expiration      06/21/2023,2359 Performed at [facility], [address] [place], [place] [address]   RPR     Status: None   Collection Time: 06/18/23  2:23 PM  Result Value Ref Range   RPR Ser Ql NON REACTIVE NON REACTIVE  Comprehensive metabolic panel     Status: Abnormal   Collection Time: 06/18/23  2:23 PM  Result Value Ref Range   Sodium 137 135 - 145 mmol/L   Potassium 3.6 3.5 - 5.1 mmol/L   Chloride 107 98 - 111 mmol/L   CO2 22 22 - 32 mmol/L   Glucose, Bld 77 70 - 99 mg/dL   BUN 5 (L) 6 - 20 mg/dL   Creatinine, Ser 1.02 0.44 - 1.00 mg/dL   Calcium 9.1 8.9 - 72.5 mg/dL   Total Protein 6.4 (L) 6.5 - 8.1 g/dL   Albumin  2.9 (L) 3.5 - 5.0 g/dL   AST 17 15 - 41 U/L   ALT 15 0 - 44 U/L   Alkaline Phosphatase 170 (H) 38 - 126 U/L   Total Bilirubin 0.5 0.0 - 1.2 mg/dL   GFR, Estimated >36 >64 mL/min   Anion gap 8 5 - 15  Protein / creatinine ratio, urine     Status: None   Collection Time: 06/18/23  3:52 PM  Result Value Ref Range   Creatinine, Urine 91 mg/dL   Total Protein, Urine 12 mg/dL    Protein Creatinine Ratio 0.13 0.00 - 0.15 mg/mg[Cre]  CBC     Status: Abnormal   Collection Time: 06/19/23  5:55 AM  Result Value Ref Range   WBC 12.9 (H) 4.0 - 10.5 K/uL   RBC 3.46 (L) 3.87 - 5.11 MIL/uL   Hemoglobin 9.2 (L) 12.0 - 15.0 g/dL   HCT 40.3 (L) 47.4 - 25.9 %   MCV 81.2 80.0 - 100.0 fL   MCH 26.6 26.0 - 34.0 pg   MCHC 32.7 30.0 - 36.0 g/dL   RDW 56.3 87.5 - 64.3 %   Platelets 327 150 - 400 K/uL   nRBC 0.0 0.0 - 0.2 %   Lochia WNL  PPD # 1  Doing well Routine care  Discharge home tomorrow

## 2023-06-19 NOTE — Lactation Note (Signed)
 This note was copied from a baby's chart. Lactation Consultation Note  Patient Name: Girl Chandelle Dinga [id number] Date: 06/19/2023 Age:32 hours Reason for consult: Initial assessment;Term  P2, Mother states she breastfed for 2 weeks with her first child and due to surgery she stopped breastfeeding and pumped for 13 mos.  She plans to do the same with this baby. Observed mother latch baby in football hold with intermittent swallows. Feed on demand with cues.  Goal 8-12+ times per day after first 24 hrs.   Mother aware of cluster feeding.  Suggest calling for help as needed.   Maternal Data Has patient been taught Hand Expression?: Yes Does the patient have breastfeeding experience prior to this delivery?: Yes How long did the patient breastfeed?: 2 weeks (pumped 13 mos.)  Feeding Mother's Current Feeding Choice: Breast Milk  LATCH Score Latch: Grasps breast easily, tongue down, lips flanged, rhythmical sucking.  Audible Swallowing: A few with stimulation  Type of Nipple: Everted at rest and after stimulation  Comfort (Breast/Nipple): Soft / non-tender  Hold (Positioning): No assistance needed to correctly position infant at breast.  LATCH Score: 9  Interventions Interventions: Breast feeding basics reviewed;Education;LC Services brochure;CDC milk storage guidelines  Discharge Pump: Personal;DEBP (Spectra )  Consult Status Consult Status: PRN   Luellen Sages  RN, IBCLC 06/19/2023, 10:22 AM

## 2023-06-19 NOTE — Anesthesia Postprocedure Evaluation (Signed)
 Anesthesia Post Note  Patient: KYLANA HASLETT  Procedure(s) Performed: AN AD HOC LABOR EPIDURAL     Patient location during evaluation: Mother Baby Anesthesia Type: Epidural Level of consciousness: awake and alert Pain management: pain level controlled Vital Signs Assessment: post-procedure vital signs reviewed and stable Respiratory status: spontaneous breathing, nonlabored ventilation and respiratory function stable Cardiovascular status: stable Postop Assessment: no headache, no backache, epidural receding, no apparent nausea or vomiting, patient able to bend at knees, able to ambulate and adequate PO intake Anesthetic complications: no   No notable events documented.  Last Vitals:  Vitals:   06/19/23 0015 06/19/23 0453  BP: 116/70 121/82  Pulse: (!) 105 78  Resp: 16 15  Temp: 36.7 C 36.8 C  SpO2: 98% 100%    Last Pain:  Vitals:   06/19/23 0715  TempSrc:   PainSc: 0-No pain   Pain Goal: Patients Stated Pain Goal: 0 (06/18/23 1233)                 Dellis Fermo

## 2023-06-20 LAB — BIRTH TISSUE RECOVERY COLLECTION (PLACENTA DONATION)

## 2023-06-20 NOTE — Discharge Summary (Signed)
 Postpartum Discharge Summary  Date of Service Jun 20, 2023     Patient Name: Ariel Thompson DOB: 12-22-1990 MRN: [id number]  Date of admission: 06/18/2023 Delivery date:06/18/2023 Delivering provider: Belle Box Date of discharge: 06/20/2023  Admitting diagnosis: Normal labor [O80, Z37.9] Intrauterine pregnancy: [redacted]w[redacted]d     Secondary diagnosis:  Principal Problem:   Normal labor  Additional problems: none    Discharge diagnosis: Term Pregnancy Delivered                                              Post partum procedures: not applicable Augmentation: N/A Complications: None  Hospital course: Onset of Labor With Vaginal Delivery      33 y.o. yo [id number] at [redacted]w[redacted]d was admitted in Active Labor on 06/18/2023. Labor course was complicated by nothing  Membrane Rupture Time/Date: 4:15 PM,06/18/2023  Delivery Method:Vaginal, Spontaneous Operative Delivery:N/A Episiotomy: None Lacerations:  2nd degree Patient had a postpartum course complicated by  nothing.  She is ambulating, tolerating a regular diet, passing flatus, and urinating well. Patient is discharged home in stable condition on 06/20/23.  Newborn Data: Birth date:06/18/2023 Birth time:9:09 PM Gender:Female Living status:Living Apgars:9 ,9  Weight:3460 g  Magnesium Sulfate received: No BMZ received: No Rhophylac:N/A MMR:N/A T-DaP:Given prenatally Flu: N/A RSV Vaccine received: No Transfusion:No Immunizations administered: Immunization History  Administered Date(s) Administered   DTaP 03/16/1992, 05/11/1992, 07/13/1992, 03/29/1993, 09/13/1997   HIB (PRP-OMP) 03/16/1992, 05/11/1992, 07/13/1992, 03/29/1993   HPV Quadrivalent 12/27/2005, 02/27/2006, 10/21/2006   Hepatitis A 12/27/2005, 10/21/2006   Hepatitis B 03/16/1992, 05/11/1992, 03/29/1993   IPV 03/16/1992, 05/11/1992, 07/13/1992, 09/13/1997   Influenza Whole 11/17/2016   Influenza,inj,Quad PF,6+ Mos 01/17/2019   Influenza-Unspecified 12/14/2017, 11/01/2019,  10/30/2020, 11/30/2021   MMR 03/29/1993, 09/13/1997   Meningococcal Conjugate 12/27/2005, 03/21/2010   Td 02/27/2006   Tdap 02/27/2006, 10/19/2020    Physical exam  Vitals:   06/19/23 1056 06/19/23 1336 06/19/23 1930 06/20/23 0501  BP: 121/77 129/84 123/79 133/84  Pulse: 85 90 88 60  Resp:  18 16 18   Temp:  98.3 F (36.8 C) 98 F (36.7 C) 98.2 F (36.8 C)  TempSrc:  Oral Oral Oral  SpO2:  99% 100% 100%  Weight:      Height:       General: alert, cooperative, and no distress Lochia: appropriate Uterine Fundus: firm Incision: Healing well with no significant drainage DVT Evaluation: No evidence of DVT seen on physical exam. Labs: Lab Results  Component Value Date   WBC 12.9 (H) 06/19/2023   HGB 9.2 (L) 06/19/2023   HCT 28.1 (L) 06/19/2023   MCV 81.2 06/19/2023   PLT 327 06/19/2023      Latest Ref Rng & Units 06/18/2023    2:23 PM  CMP  Glucose 70 - 99 mg/dL 77   BUN 6 - 20 mg/dL 5   Creatinine 1.47 - 8.29 mg/dL 5.62   Sodium 130 - 865 mmol/L 137   Potassium 3.5 - 5.1 mmol/L 3.6   Chloride 98 - 111 mmol/L 107   CO2 22 - 32 mmol/L 22   Calcium 8.9 - 10.3 mg/dL 9.1   Total Protein 6.5 - 8.1 g/dL 6.4   Total Bilirubin 0.0 - 1.2 mg/dL 0.5   Alkaline Phos 38 - 126 U/L 170   AST 15 - 41 U/L 17   ALT 0 - 44 U/L 15  Edinburgh Score:    06/19/2023    6:00 PM  Edinburgh Postnatal Depression Scale Screening Tool  I have been able to laugh and see the funny side of things. 0  I have looked forward with enjoyment to things. 0  I have blamed myself unnecessarily when things went wrong. 0  I have been anxious or worried for no good reason. 1  I have felt scared or panicky for no good reason. 0  Things have been getting on top of me. 0  I have been so unhappy that I have had difficulty sleeping. 0  I have felt sad or miserable. 0  I have been so unhappy that I have been crying. 0  The thought of harming myself has occurred to me. 0  Edinburgh Postnatal Depression Scale  Total 1      After visit meds:  Allergies as of 06/20/2023       Reactions   Hydrocodone-acetaminophen     tachycardia, anxiety, hallucinations.        Medication List     TAKE these medications    cyclobenzaprine  5 MG tablet Commonly known as: FLEXERIL  Take 1 tablet (5 mg total) by mouth 3 (three) times daily as needed for muscle spasms.   Doxylamine -Pyridoxine  10-10 MG Tbec Take by mouth.   DULoxetine  30 MG capsule Commonly known as: CYMBALTA  Take by mouth.   fexofenadine 180 MG tablet Commonly known as: ALLEGRA Take 180 mg by mouth at bedtime.   PRENATAL ADULT GUMMY/DHA/FA PO Take 1 tablet by mouth at bedtime.         Discharge home in stable condition Infant Feeding: Breast Infant Disposition:home with mother Discharge instruction: per After Visit Summary and Postpartum booklet. Activity: Advance as tolerated. Pelvic rest for 6 weeks.  Diet: routine diet Anticipated Birth Control: Unsure Postpartum Appointment:6 weeks Additional Postpartum F/U:  not applicable Future Appointments: Future Appointments  Date Time Provider Department Center  10/01/2023  3:00 PM Harris Liming, MD [facility] None   Follow up Visit:      06/20/2023 Martine Sleek, MD

## 2023-06-23 ENCOUNTER — Inpatient Hospital Stay (HOSPITAL_COMMUNITY): Admission: AD | Admit: 2023-06-23 | Source: Home / Self Care | Admitting: Obstetrics and Gynecology

## 2023-06-23 ENCOUNTER — Inpatient Hospital Stay (HOSPITAL_COMMUNITY)

## 2023-07-01 ENCOUNTER — Telehealth (HOSPITAL_COMMUNITY): Payer: Self-pay | Admitting: *Deleted

## 2023-07-01 NOTE — Telephone Encounter (Signed)
 Attempted hospital discharge follow-up call. Left message for patient to return RN call with any questions or concerns. Julien Odor, RN, 07/01/23, [phone]

## 2023-07-05 ENCOUNTER — Emergency Department
Admission: EM | Admit: 2023-07-05 | Discharge: 2023-07-05 | Disposition: A | Discharge status: Home / Self Care | Attending: Emergency Medicine | Admitting: Emergency Medicine

## 2023-07-05 ENCOUNTER — Encounter: Payer: Self-pay | Admitting: Emergency Medicine

## 2023-07-05 ENCOUNTER — Other Ambulatory Visit: Payer: Self-pay

## 2023-07-05 DIAGNOSIS — R112 Nausea with vomiting, unspecified: Secondary | ICD-10-CM | POA: Diagnosis not present

## 2023-07-05 DIAGNOSIS — N61 Mastitis without abscess: Secondary | ICD-10-CM

## 2023-07-05 DIAGNOSIS — R509 Fever, unspecified: Secondary | ICD-10-CM | POA: Diagnosis present

## 2023-07-05 LAB — COMPREHENSIVE METABOLIC PANEL WITH GFR
ALT: 28 U/L (ref 0–44)
AST: 23 U/L (ref 15–41)
Albumin: 3.8 g/dL (ref 3.5–5.0)
Alkaline Phosphatase: 90 U/L (ref 38–126)
Anion gap: 12 (ref 5–15)
BUN: 15 mg/dL (ref 6–20)
CO2: 20 mmol/L — ABNORMAL LOW (ref 22–32)
Calcium: 8.7 mg/dL — ABNORMAL LOW (ref 8.9–10.3)
Chloride: 102 mmol/L (ref 98–111)
Creatinine, Ser: 0.79 mg/dL (ref 0.44–1.00)
GFR, Estimated: >60 mL/min
Glucose, Bld: 92 mg/dL (ref 70–99)
Potassium: 3.6 mmol/L (ref 3.5–5.1)
Sodium: 134 mmol/L — ABNORMAL LOW (ref 135–145)
Total Bilirubin: 1 mg/dL (ref 0.0–1.2)
Total Protein: 7.1 g/dL (ref 6.5–8.1)

## 2023-07-05 LAB — CBC
HCT: 34.8 % — ABNORMAL LOW (ref 36.0–46.0)
Hemoglobin: 11.5 g/dL — ABNORMAL LOW (ref 12.0–15.0)
MCH: 26.9 pg (ref 26.0–34.0)
MCHC: 33 g/dL (ref 30.0–36.0)
MCV: 81.5 fL (ref 80.0–100.0)
Platelets: 362 K/uL (ref 150–400)
RBC: 4.27 MIL/uL (ref 3.87–5.11)
RDW: 13.6 % (ref 11.5–15.5)
WBC: 12.8 K/uL — ABNORMAL HIGH (ref 4.0–10.5)
nRBC: 0 % (ref 0.0–0.2)

## 2023-07-05 LAB — LACTIC ACID, PLASMA: Lactic Acid, Venous: 1.2 mmol/L (ref 0.5–1.9)

## 2023-07-05 MED ORDER — ACETAMINOPHEN 500 MG PO TABS
1000.0000 mg | ORAL_TABLET | Freq: Once | ORAL | Status: AC
  Administered 2023-07-05: 1000 mg via ORAL
  Filled 2023-07-05: qty 2

## 2023-07-05 MED ORDER — ONDANSETRON 4 MG PO TBDP: 4.0000 mg | ORAL_TABLET | Freq: Three times a day (TID) | ORAL | 0 refills | Status: AC | PRN

## 2023-07-05 MED ORDER — ONDANSETRON HCL 4 MG/2ML IJ SOLN
4.0000 mg | Freq: Once | INTRAMUSCULAR | Status: AC
  Administered 2023-07-05: 4 mg via INTRAVENOUS
  Filled 2023-07-05: qty 2

## 2023-07-05 MED ORDER — DICLOXACILLIN SODIUM 500 MG PO CAPS: 500.0000 mg | ORAL_CAPSULE | Freq: Four times a day (QID) | ORAL | 0 refills | Status: AC

## 2023-07-05 MED ORDER — DICLOXACILLIN SODIUM 500 MG PO CAPS
500.0000 mg | ORAL_CAPSULE | Freq: Once | ORAL | Status: AC
  Administered 2023-07-05: 500 mg via ORAL
  Filled 2023-07-05: qty 1

## 2023-07-05 MED ORDER — SODIUM CHLORIDE 0.9 % IV BOLUS
1000.0000 mL | Freq: Once | INTRAVENOUS | Status: AC
  Administered 2023-07-05: 1000 mL via INTRAVENOUS

## 2023-07-05 NOTE — Discharge Instructions (Addendum)
 Please take antibiotic as prescribed for its entire course.  Please take your nausea medication if needed, as written.  Please continue to breast-feed or pump frequently.  Return to the emergency department if you are unable to tolerate antibiotics due to nausea or vomiting, continued to spike high fevers after 24 hours of antibiotics, or any other symptom personally concerning to yourself.

## 2023-07-05 NOTE — ED Triage Notes (Signed)
 Pt in POV with reported mastitis to R breast - temp 101 at home, took Ibu and Tylenol  at 0100. Emesis episode at 0300. Tachy in 140's

## 2023-07-05 NOTE — ED Provider Notes (Signed)
 [facility] Provider Note    Event Date/Time   First MD Initiated Contact with Patient 07/05/23 0542     (approximate)  History   Chief Complaint: Mastitis, Fever, and Emesis  HPI  Ariel Thompson is a 33 y.o. female with a past medical history of depression, recent delivery currently breast-feeding, presents with fever nausea and right breast pain.  Patient states over the last 24 hours or so she has had worsening pain and redness of the right breast.  Now fever to 101 at home earlier today.  Patient took ibuprofen  and Tylenol  around 1 AM.  Patient states she became nauseated had a episode of vomiting around 3 AM so she called her OB who recommended that they come to the emergency department.  Patient is pumping/breast-feeding her 24-week-old infant.  Physical Exam   Triage Vital Signs: ED Triage Vitals  Encounter Vitals Group     BP 07/05/23 0539 107/76     Systolic BP Percentile --      Diastolic BP Percentile --      Pulse Rate 07/05/23 0539 (!) 142     Resp 07/05/23 0539 20     Temp 07/05/23 0549 98.3 F (36.8 C)     Temp Source 07/05/23 0549 Oral     SpO2 07/05/23 0539 100 %     Weight 07/05/23 0537 200 lb (90.7 kg)     Height --      Head Circumference --      Peak Flow --      Pain Score 07/05/23 0537 7     Pain Loc --      Pain Education --      Exclude from Growth Chart --     Most recent vital signs: Vitals:   07/05/23 0539 07/05/23 0549  BP: 107/76   Pulse: (!) 142   Resp: 20   Temp:  98.3 F (36.8 C)  SpO2: 100%     General: Awake, no distress.  CV:  Good peripheral perfusion.  Regular rhythm rate around 130 bpm. Resp:  Normal effort.  Equal breath sounds bilaterally.  Abd:  No distention.  Soft Other:  Patient has moderate tenderness of the right breast with mild erythema surrounding the nipple and and also somewhat superior to the areola.   ED Results / Procedures / Treatments   EKG  EKG viewed and interpreted by  myself shows sinus tachycardia 131 bpm with a narrow QRS, normal axis, normal intervals besides QTc prolongation, nonspecific ST changes.   MEDICATIONS ORDERED IN ED: Medications  sodium chloride  0.9 % bolus 1,000 mL (has no administration in time range)  acetaminophen  (TYLENOL ) tablet 1,000 mg (has no administration in time range)  ondansetron  (ZOFRAN ) injection 4 mg (has no administration in time range)  dicloxacillin  (DYNAPEN ) capsule 500 mg (has no administration in time range)     IMPRESSION / MDM / ASSESSMENT AND PLAN / ED COURSE  I reviewed the triage vital signs and the nursing notes.  Patient's presentation is most consistent with acute presentation with potential threat to life or bodily function.  Patient presents emergency department for right breast pain redness fever to 101 at home with an episode of nausea and vomiting.  Patient's clinical exam is consistent with mastitis.  She is afebrile currently 98.3 but continues to be tachycardic.  We will dose IV fluids Toradol  and Tylenol  as it has been 5 hours since her last dose.  We will check labs and dose  dicloxacillin .  Will continue to closely monitor while awaiting results.  Patient's lab work is overall reassuring white count of 12,000, reassuring chemistry, normal lactic acid.  Patient receiving fluids we will reassess.  Patient care signed out to oncoming provider.  Likely discharge home.  FINAL CLINICAL IMPRESSION(S) / ED DIAGNOSES   Mastitis    Note:  This document was prepared using Dragon voice recognition software and may include unintentional dictation errors.   Ruth Cove, MD 07/09/23 2209

## 2023-07-05 NOTE — ED Provider Notes (Signed)
-----------------------------------------   7:00 AM on 07/05/2023 -----------------------------------------  Blood pressure 107/70, pulse (!) 121, temperature 98.3 F (36.8 C), temperature source Oral, resp. rate 18, weight 90.7 kg, SpO2 99%, currently breastfeeding.  Assuming care from Dr. Azalee Bolds.  In short, Ariel Thompson is a 33 y.o. female with a chief complaint of Mastitis, Fever, and Emesis .  Refer to the original H&P for additional details.  The current plan of care is to reassess following additional IVF for mastitis with tachycardia.  ----------------------------------------- 7:54 AM on 07/05/2023 ----------------------------------------- On reassessment, patient reports feeling much better following IV fluids and after she was able to use breast pump.  She is eager to go home and is appropriate for outpatient management with antibiotics as prescribed by Dr. Azalee Bolds.  She was encouraged to follow-up with her PCP or OB/GYN, otherwise return to the ED for new or worsening symptoms.  Patient agrees with plan.       Twilla Galea, MD 07/05/23 [phone]

## 2023-10-01 ENCOUNTER — Ambulatory Visit: Payer: BC Managed Care – PPO | Admitting: Dermatology

## 2023-10-01 ENCOUNTER — Encounter: Payer: Self-pay | Admitting: Dermatology

## 2023-10-01 DIAGNOSIS — L578 Other skin changes due to chronic exposure to nonionizing radiation: Secondary | ICD-10-CM | POA: Diagnosis not present

## 2023-10-01 DIAGNOSIS — L821 Other seborrheic keratosis: Secondary | ICD-10-CM

## 2023-10-01 DIAGNOSIS — L814 Other melanin hyperpigmentation: Secondary | ICD-10-CM | POA: Diagnosis not present

## 2023-10-01 DIAGNOSIS — D229 Melanocytic nevi, unspecified: Secondary | ICD-10-CM

## 2023-10-01 DIAGNOSIS — L219 Seborrheic dermatitis, unspecified: Secondary | ICD-10-CM

## 2023-10-01 DIAGNOSIS — Z1283 Encounter for screening for malignant neoplasm of skin: Secondary | ICD-10-CM | POA: Diagnosis not present

## 2023-10-01 DIAGNOSIS — W908XXA Exposure to other nonionizing radiation, initial encounter: Secondary | ICD-10-CM

## 2023-10-01 DIAGNOSIS — Z86018 Personal history of other benign neoplasm: Secondary | ICD-10-CM

## 2023-10-01 DIAGNOSIS — D1801 Hemangioma of skin and subcutaneous tissue: Secondary | ICD-10-CM

## 2023-10-01 MED ORDER — KETOCONAZOLE 2 % EX SHAM: 1.0000 | MEDICATED_SHAMPOO | CUTANEOUS | 11 refills | Status: AC

## 2023-10-01 NOTE — Progress Notes (Signed)
 Follow-Up Visit   Subjective  Ariel Thompson is a 33 y.o. female who presents for the following: Skin Cancer Screening and Full Body Skin Exam hx of Dysplastic Nevus, new spot above R brow appeared when she was pregnant, New dark mole L calf, check moles that were looked at last year L abd and L breast, Seb derm scalp stopped Ketoconazle 2% shampoo when pregnant but worked well and would like to restart  The patient presents for Total-Body Skin Exam (TBSE) for skin cancer screening and mole check. The patient has spots, moles and lesions to be evaluated, some may be new or changing and the patient may have concern these could be cancer.    The following portions of the chart were reviewed this encounter and updated as appropriate: medications, allergies, medical history  Review of Systems:  No other skin or systemic complaints except as noted in HPI or Assessment and Plan.  Objective  Well appearing patient in no apparent distress; mood and affect are within normal limits.  A full examination was performed including scalp, head, eyes, ears, nose, lips, neck, chest, axillae, abdomen, back, buttocks, bilateral upper extremities, bilateral lower extremities, hands, feet, fingers, toes, fingernails, and toenails. All findings within normal limits unless otherwise noted below.   Relevant physical exam findings are noted in the Assessment and Plan.    Assessment & Plan   SKIN CANCER SCREENING PERFORMED TODAY.  ACTINIC DAMAGE - Chronic condition, secondary to cumulative UV/sun exposure - diffuse scaly erythematous macules with underlying dyspigmentation - Recommend daily broad spectrum sunscreen SPF 30+ to sun-exposed areas, reapply every 2 hours as needed.  - Staying in the shade or wearing long sleeves, sun glasses (UVA+UVB protection) and wide brim hats (4-inch brim around the entire circumference of the hat) are also recommended for sun protection.  - Call for new or changing  lesions.  LENTIGINES, SEBORRHEIC KERATOSES, HEMANGIOMAS - Benign normal skin lesions - Benign-appearing - Call for any changes - Lentigo above R brow  MELANOCYTIC NEVI - Tan-brown and/or pink-flesh-colored symmetric macules and papules - Benign appearing on exam today - Observation - Call clinic for new or changing moles - Recommend daily use of broad spectrum spf 30+ sunscreen to sun-exposed areas.  Nevus L abdomen, L breast, L calf benign appearing  HISTORY OF DYSPLASTIC NEVUS No evidence of recurrence today Recommend regular full body skin exams Recommend daily broad spectrum sunscreen SPF 30+ to sun-exposed areas, reapply every 2 hours as needed.  Call if any new or changing lesions are noted between office visits  - L thigh  SEBORRHEIC DERMATITIS Scalp  Exam: diffuse scale at scalp  Chronic and persistent condition with duration or expected duration over one year. Condition is symptomatic/ bothersome to patient. Not currently at goal.   Seborrheic Dermatitis is a chronic persistent rash characterized by pinkness and scaling most commonly of the mid face but also can occur on the scalp (dandruff), ears; mid chest, mid back and groin.  It tends to be exacerbated by stress and cooler weather.  People who have neurologic disease may experience new onset or exacerbation of existing seborrheic dermatitis.  The condition is not curable but treatable and can be controlled.  Treatment Plan: Restart Ketoconazole  2% shampoo 2-3/wk, let sit 5 minutes and rinse out  Long term medication management.  Patient is using long term (months to years) prescription medication  to control their dermatologic condition.  These medications require periodic monitoring to evaluate for efficacy and side effects  and may require periodic laboratory monitoring.     MULTIPLE BENIGN NEVI   LENTIGINES   ACTINIC ELASTOSIS   SEBORRHEIC KERATOSES   CHERRY ANGIOMA   SEBORRHEIC  DERMATITIS   Related Medications ketoconazole  (NIZORAL ) 2 % shampoo Apply 1 Application topically 3 (three) times a week. 3 times weekly shampoo into scalp let sit on scalp for 5 minutes and rinse out, for seborrheic dermatitis Return in about 1 year (around 09/30/2024) for TBSE, Hx of Dysplastic nevi.  I, Grayce Saunas, RMA, am acting as scribe for Boneta Sharps, MD .   Documentation: I have reviewed the above documentation for accuracy and completeness, and I agree with the above.  Boneta Sharps, MD

## 2023-10-01 NOTE — Patient Instructions (Addendum)

## 2024-10-06 ENCOUNTER — Ambulatory Visit: Admitting: Dermatology
